# Patient Record
Sex: Female | Born: 1975 | Race: Black or African American | Hispanic: No | Marital: Married | Smoking: Never smoker
Health system: Southern US, Community
[De-identification: ages and names within clinical notes are randomized; demographics above are authoritative.]

## PROBLEM LIST (undated history)

## (undated) DIAGNOSIS — Z91018 Allergy to other foods: Secondary | ICD-10-CM

## (undated) DIAGNOSIS — Z91013 Allergy to seafood: Secondary | ICD-10-CM

## (undated) DIAGNOSIS — R0602 Shortness of breath: Secondary | ICD-10-CM

## (undated) HISTORY — DX: Allergy to seafood: Z91.013

## (undated) HISTORY — DX: Allergy to other foods: Z91.018

## (undated) HISTORY — DX: Shortness of breath: R06.02

---

## 2017-12-01 ENCOUNTER — Emergency Department (HOSPITAL_COMMUNITY): Payer: Medicaid Other

## 2017-12-01 ENCOUNTER — Observation Stay (HOSPITAL_COMMUNITY): Payer: Medicaid Other | Admitting: Anesthesiology

## 2017-12-01 ENCOUNTER — Encounter (HOSPITAL_COMMUNITY)
Admission: EM | Disposition: A | Discharge status: Home / Self Care | Payer: Self-pay | Source: Home / Self Care | Attending: Emergency Medicine

## 2017-12-01 ENCOUNTER — Observation Stay (HOSPITAL_COMMUNITY)
Admission: EM | Admit: 2017-12-01 | Discharge: 2017-12-02 | Disposition: A | Discharge status: Home / Self Care | Payer: Medicaid Other | Attending: General Surgery | Admitting: General Surgery

## 2017-12-01 ENCOUNTER — Encounter (HOSPITAL_COMMUNITY): Payer: Self-pay | Admitting: *Deleted

## 2017-12-01 DIAGNOSIS — R109 Unspecified abdominal pain: Secondary | ICD-10-CM | POA: Diagnosis present

## 2017-12-01 DIAGNOSIS — K3533 Acute appendicitis with perforation and localized peritonitis, with abscess: Principal | ICD-10-CM | POA: Insufficient documentation

## 2017-12-01 DIAGNOSIS — K3580 Unspecified acute appendicitis: Secondary | ICD-10-CM | POA: Diagnosis present

## 2017-12-01 HISTORY — PX: LAPAROSCOPIC APPENDECTOMY: SHX408

## 2017-12-01 LAB — COMPREHENSIVE METABOLIC PANEL
ALT: 36 U/L (ref 0–44)
AST: 24 U/L (ref 15–41)
Albumin: 3.4 g/dL — ABNORMAL LOW (ref 3.5–5.0)
Alkaline Phosphatase: 99 U/L (ref 38–126)
Anion gap: 9 (ref 5–15)
BUN: 6 mg/dL (ref 6–20)
CO2: 23 mmol/L (ref 22–32)
Calcium: 9.1 mg/dL (ref 8.9–10.3)
Chloride: 103 mmol/L (ref 98–111)
Creatinine, Ser: 0.78 mg/dL (ref 0.44–1.00)
GFR calc Af Amer: >60 mL/min (ref >60)
GFR calc non Af Amer: >60 mL/min (ref >60)
Glucose, Bld: 114 mg/dL — ABNORMAL HIGH (ref 70–99)
Potassium: 4.2 mmol/L (ref 3.5–5.1)
Sodium: 135 mmol/L (ref 135–145)
Total Bilirubin: 1 mg/dL (ref 0.3–1.2)
Total Protein: 7.6 g/dL (ref 6.5–8.1)

## 2017-12-01 LAB — CBC
HCT: 40.5 % (ref 36.0–46.0)
Hemoglobin: 13 g/dL (ref 12.0–15.0)
MCH: 27.5 pg (ref 26.0–34.0)
MCHC: 32.1 g/dL (ref 30.0–36.0)
MCV: 85.6 fL (ref 78.0–100.0)
Platelets: 411 10*3/uL — ABNORMAL HIGH (ref 150–400)
RBC: 4.73 MIL/uL (ref 3.87–5.11)
RDW: 14.5 % (ref 11.5–15.5)
WBC: 22.2 10*3/uL — ABNORMAL HIGH (ref 4.0–10.5)

## 2017-12-01 LAB — I-STAT BETA HCG BLOOD, ED (MC, WL, AP ONLY): I-stat hCG, quantitative: <5 m[IU]/mL (ref <5)

## 2017-12-01 LAB — LIPASE, BLOOD: Lipase: 24 U/L (ref 11–51)

## 2017-12-01 SURGERY — APPENDECTOMY, LAPAROSCOPIC
Anesthesia: General

## 2017-12-01 MED ORDER — MIDAZOLAM HCL 2 MG/2ML IJ SOLN
INTRAMUSCULAR | Status: AC
  Filled 2017-12-01: qty 2

## 2017-12-01 MED ORDER — GLYCOPYRROLATE 0.2 MG/ML IJ SOLN
INTRAMUSCULAR | Status: DC | PRN
  Administered 2017-12-01: 0.2 mg via INTRAVENOUS

## 2017-12-01 MED ORDER — TRAMADOL HCL 50 MG PO TABS
50.0000 mg | ORAL_TABLET | Freq: Four times a day (QID) | ORAL | Status: DC | PRN
  Administered 2017-12-02: 50 mg via ORAL
  Filled 2017-12-01: qty 1

## 2017-12-01 MED ORDER — SODIUM CHLORIDE 0.9 % IV SOLN
1.0000 g | INTRAVENOUS | Status: DC
  Administered 2017-12-01: 1 g via INTRAVENOUS
  Filled 2017-12-01 (×2): qty 10

## 2017-12-01 MED ORDER — PROPOFOL 10 MG/ML IV BOLUS
INTRAVENOUS | Status: AC
  Filled 2017-12-01: qty 20

## 2017-12-01 MED ORDER — MIDAZOLAM HCL 5 MG/5ML IJ SOLN
INTRAMUSCULAR | Status: DC | PRN
  Administered 2017-12-01: 2 mg via INTRAVENOUS

## 2017-12-01 MED ORDER — DEXAMETHASONE SODIUM PHOSPHATE 10 MG/ML IJ SOLN
INTRAMUSCULAR | Status: DC | PRN
  Administered 2017-12-01: 10 mg via INTRAVENOUS

## 2017-12-01 MED ORDER — LIDOCAINE HCL (CARDIAC) PF 100 MG/5ML IV SOSY
PREFILLED_SYRINGE | INTRAVENOUS | Status: DC | PRN
  Administered 2017-12-01: 100 mg via INTRATRACHEAL

## 2017-12-01 MED ORDER — LACTATED RINGERS IV SOLN
INTRAVENOUS | Status: DC | PRN
  Administered 2017-12-01: 20:00:00 via INTRAVENOUS

## 2017-12-01 MED ORDER — SUGAMMADEX SODIUM 200 MG/2ML IV SOLN
INTRAVENOUS | Status: DC | PRN
  Administered 2017-12-01 (×2): 100 mg via INTRAVENOUS

## 2017-12-01 MED ORDER — GABAPENTIN 300 MG PO CAPS: 300.0000 mg | ORAL_CAPSULE | ORAL | Status: DC

## 2017-12-01 MED ORDER — METRONIDAZOLE IN NACL 5-0.79 MG/ML-% IV SOLN
INTRAVENOUS | Status: DC | PRN
  Administered 2017-12-01: 500 mg via INTRAVENOUS

## 2017-12-01 MED ORDER — 0.9 % SODIUM CHLORIDE (POUR BTL) OPTIME
TOPICAL | Status: DC | PRN
  Administered 2017-12-01: 1000 mL

## 2017-12-01 MED ORDER — GABAPENTIN 300 MG PO CAPS
300.0000 mg | ORAL_CAPSULE | Freq: Two times a day (BID) | ORAL | Status: DC
  Administered 2017-12-01 – 2017-12-02 (×2): 300 mg via ORAL
  Filled 2017-12-01 (×2): qty 1

## 2017-12-01 MED ORDER — DIPHENHYDRAMINE HCL 50 MG/ML IJ SOLN
25.0000 mg | Freq: Once | INTRAMUSCULAR | Status: AC
  Administered 2017-12-01: 25 mg via INTRAVENOUS
  Filled 2017-12-01: qty 1

## 2017-12-01 MED ORDER — BUPIVACAINE HCL 0.25 % IJ SOLN
INTRAMUSCULAR | Status: DC | PRN
  Administered 2017-12-01: 7 mL

## 2017-12-01 MED ORDER — DIPHENHYDRAMINE HCL 50 MG/ML IJ SOLN
12.5000 mg | Freq: Once | INTRAMUSCULAR | Status: AC
  Administered 2017-12-01: 12.5 mg via INTRAVENOUS

## 2017-12-01 MED ORDER — ONDANSETRON HCL 4 MG/2ML IJ SOLN: 4.0000 mg | Freq: Four times a day (QID) | INTRAMUSCULAR | Status: DC | PRN

## 2017-12-01 MED ORDER — SODIUM CHLORIDE 0.9 % IR SOLN
Status: DC | PRN
  Administered 2017-12-01: 1000 mL

## 2017-12-01 MED ORDER — ROCURONIUM BROMIDE 100 MG/10ML IV SOLN
INTRAVENOUS | Status: DC | PRN
  Administered 2017-12-01: 30 mg via INTRAVENOUS

## 2017-12-01 MED ORDER — FENTANYL CITRATE (PF) 100 MCG/2ML IJ SOLN
50.0000 ug | Freq: Once | INTRAMUSCULAR | Status: DC
  Filled 2017-12-01: qty 2

## 2017-12-01 MED ORDER — ALBUTEROL SULFATE (2.5 MG/3ML) 0.083% IN NEBU
INHALATION_SOLUTION | RESPIRATORY_TRACT | Status: AC
  Administered 2017-12-01: 2.5 mg via RESPIRATORY_TRACT
  Filled 2017-12-01: qty 3

## 2017-12-01 MED ORDER — DEXTROSE-NACL 5-0.9 % IV SOLN
INTRAVENOUS | Status: DC
  Administered 2017-12-01: 23:00:00 via INTRAVENOUS

## 2017-12-01 MED ORDER — SUFENTANIL CITRATE 50 MCG/ML IV SOLN
INTRAVENOUS | Status: AC
  Filled 2017-12-01: qty 1

## 2017-12-01 MED ORDER — SUCCINYLCHOLINE CHLORIDE 20 MG/ML IJ SOLN
INTRAMUSCULAR | Status: DC | PRN
  Administered 2017-12-01: 120 mg via INTRAVENOUS

## 2017-12-01 MED ORDER — PROPOFOL 10 MG/ML IV BOLUS
INTRAVENOUS | Status: DC | PRN
  Administered 2017-12-01: 200 mg via INTRAVENOUS

## 2017-12-01 MED ORDER — DIPHENHYDRAMINE HCL 50 MG/ML IJ SOLN
INTRAMUSCULAR | Status: AC
  Administered 2017-12-01: 12.5 mg via INTRAVENOUS
  Filled 2017-12-01: qty 1

## 2017-12-01 MED ORDER — SUFENTANIL CITRATE 50 MCG/ML IV SOLN
INTRAVENOUS | Status: DC | PRN
  Administered 2017-12-01 (×3): 10 ug via INTRAVENOUS

## 2017-12-01 MED ORDER — CELECOXIB 400 MG PO CAPS: 400.0000 mg | ORAL_CAPSULE | ORAL | Status: DC

## 2017-12-01 MED ORDER — FENTANYL CITRATE (PF) 100 MCG/2ML IJ SOLN: 25.0000 ug | INTRAMUSCULAR | Status: DC | PRN

## 2017-12-01 MED ORDER — BUPIVACAINE HCL (PF) 0.25 % IJ SOLN
INTRAMUSCULAR | Status: AC
  Filled 2017-12-01: qty 30

## 2017-12-01 MED ORDER — METRONIDAZOLE IN NACL 5-0.79 MG/ML-% IV SOLN
500.0000 mg | Freq: Three times a day (TID) | INTRAVENOUS | Status: DC
  Filled 2017-12-01 (×2): qty 100

## 2017-12-01 MED ORDER — ACETAMINOPHEN 10 MG/ML IV SOLN: 1000.0000 mg | Freq: Once | INTRAVENOUS | Status: DC | PRN

## 2017-12-01 MED ORDER — ALBUTEROL SULFATE (2.5 MG/3ML) 0.083% IN NEBU
2.5000 mg | INHALATION_SOLUTION | Freq: Once | RESPIRATORY_TRACT | Status: AC
  Administered 2017-12-01: 2.5 mg via RESPIRATORY_TRACT

## 2017-12-01 MED ORDER — KETOROLAC TROMETHAMINE 30 MG/ML IJ SOLN
30.0000 mg | Freq: Four times a day (QID) | INTRAMUSCULAR | Status: DC | PRN
  Administered 2017-12-01: 30 mg via INTRAVENOUS
  Filled 2017-12-01: qty 1

## 2017-12-01 MED ORDER — KETOROLAC TROMETHAMINE 30 MG/ML IJ SOLN: 30.0000 mg | Freq: Four times a day (QID) | INTRAMUSCULAR | Status: DC | PRN

## 2017-12-01 MED ORDER — CELECOXIB 200 MG PO CAPS
200.0000 mg | ORAL_CAPSULE | Freq: Two times a day (BID) | ORAL | Status: DC
  Administered 2017-12-01: 200 mg via ORAL
  Filled 2017-12-01: qty 1

## 2017-12-01 MED ORDER — ACETAMINOPHEN 500 MG PO TABS: 1000.0000 mg | ORAL_TABLET | ORAL | Status: DC

## 2017-12-01 MED ORDER — ONDANSETRON 4 MG PO TBDP: 4.0000 mg | ORAL_TABLET | Freq: Four times a day (QID) | ORAL | Status: DC | PRN

## 2017-12-01 MED ORDER — IOHEXOL 300 MG/ML  SOLN
100.0000 mL | Freq: Once | INTRAMUSCULAR | Status: AC | PRN
  Administered 2017-12-01: 100 mL via INTRAVENOUS

## 2017-12-01 MED ORDER — METRONIDAZOLE IN NACL 5-0.79 MG/ML-% IV SOLN
500.0000 mg | Freq: Three times a day (TID) | INTRAVENOUS | Status: DC
  Administered 2017-12-01 – 2017-12-02 (×2): 500 mg via INTRAVENOUS
  Filled 2017-12-01 (×3): qty 100

## 2017-12-01 MED ORDER — LACTATED RINGERS IV SOLN: INTRAVENOUS | Status: DC

## 2017-12-01 MED ORDER — HYDROMORPHONE HCL 1 MG/ML IJ SOLN
0.5000 mg | INTRAMUSCULAR | Status: DC | PRN
  Filled 2017-12-01: qty 1

## 2017-12-01 MED ORDER — ONDANSETRON HCL 4 MG/2ML IJ SOLN
INTRAMUSCULAR | Status: DC | PRN
  Administered 2017-12-01: 4 mg via INTRAVENOUS

## 2017-12-01 MED ORDER — DEXTROSE-NACL 5-0.9 % IV SOLN
INTRAVENOUS | Status: DC
  Administered 2017-12-01: 18:00:00 via INTRAVENOUS

## 2017-12-01 MED ORDER — ONDANSETRON HCL 4 MG/2ML IJ SOLN: 4.0000 mg | Freq: Once | INTRAMUSCULAR | Status: DC | PRN

## 2017-12-01 SURGICAL SUPPLY — 43 items
APPLIER CLIP 5 13 M/L LIGAMAX5 (MISCELLANEOUS)
BENZOIN TINCTURE PRP APPL 2/3 (GAUZE/BANDAGES/DRESSINGS) ×2 IMPLANT
BLADE CLIPPER SURG (BLADE) IMPLANT
CANISTER SUCT 3000ML PPV (MISCELLANEOUS) ×2 IMPLANT
CHLORAPREP W/TINT 26ML (MISCELLANEOUS) ×2 IMPLANT
CLIP APPLIE 5 13 M/L LIGAMAX5 (MISCELLANEOUS) IMPLANT
CONT SPEC 4OZ CLIKSEAL STRL BL (MISCELLANEOUS) ×2 IMPLANT
COVER SURGICAL LIGHT HANDLE (MISCELLANEOUS) ×2 IMPLANT
COVER TRANSDUCER ULTRASND (DRAPES) ×2 IMPLANT
DECANTER SPIKE VIAL GLASS SM (MISCELLANEOUS) ×2 IMPLANT
DERMABOND ADVANCED (GAUZE/BANDAGES/DRESSINGS) ×1
DERMABOND ADVANCED .7 DNX12 (GAUZE/BANDAGES/DRESSINGS) ×1 IMPLANT
ELECT REM PT RETURN 9FT ADLT (ELECTROSURGICAL) ×2
ELECTRODE REM PT RTRN 9FT ADLT (ELECTROSURGICAL) ×1 IMPLANT
ENDOLOOP SUT PDS II  0 18 (SUTURE) ×3
ENDOLOOP SUT PDS II 0 18 (SUTURE) ×3 IMPLANT
GAUZE SPONGE 2X2 8PLY STRL LF (GAUZE/BANDAGES/DRESSINGS) ×1 IMPLANT
GLOVE BIO SURGEON STRL SZ7.5 (GLOVE) ×2 IMPLANT
GOWN STRL REUS W/ TWL LRG LVL3 (GOWN DISPOSABLE) ×2 IMPLANT
GOWN STRL REUS W/ TWL XL LVL3 (GOWN DISPOSABLE) ×1 IMPLANT
GOWN STRL REUS W/TWL LRG LVL3 (GOWN DISPOSABLE) ×2
GOWN STRL REUS W/TWL XL LVL3 (GOWN DISPOSABLE) ×1
GRASPER SUT TROCAR 14GX15 (MISCELLANEOUS) ×2 IMPLANT
KIT BASIN OR (CUSTOM PROCEDURE TRAY) ×2 IMPLANT
KIT TURNOVER KIT B (KITS) ×2 IMPLANT
NEEDLE INSUFFLATION 14GA 120MM (NEEDLE) ×2 IMPLANT
NS IRRIG 1000ML POUR BTL (IV SOLUTION) ×2 IMPLANT
PAD ARMBOARD 7.5X6 YLW CONV (MISCELLANEOUS) ×4 IMPLANT
SCISSORS LAP 5X35 DISP (ENDOMECHANICALS) ×2 IMPLANT
SET IRRIG TUBING LAPAROSCOPIC (IRRIGATION / IRRIGATOR) ×2 IMPLANT
SLEEVE ENDOPATH XCEL 5M (ENDOMECHANICALS) ×2 IMPLANT
SPECIMEN JAR SMALL (MISCELLANEOUS) ×2 IMPLANT
SPONGE GAUZE 2X2 STER 10/PKG (GAUZE/BANDAGES/DRESSINGS) ×1
STRIP CLOSURE SKIN 1/2X4 (GAUZE/BANDAGES/DRESSINGS) ×2 IMPLANT
SUT MNCRL AB 4-0 PS2 18 (SUTURE) ×2 IMPLANT
TOWEL OR 17X24 6PK STRL BLUE (TOWEL DISPOSABLE) ×2 IMPLANT
TOWEL OR 17X26 10 PK STRL BLUE (TOWEL DISPOSABLE) ×2 IMPLANT
TRAY FOLEY CATH SILVER 16FR (SET/KITS/TRAYS/PACK) ×2 IMPLANT
TRAY LAPAROSCOPIC MC (CUSTOM PROCEDURE TRAY) ×2 IMPLANT
TROCAR XCEL NON-BLD 11X100MML (ENDOMECHANICALS) ×2 IMPLANT
TROCAR XCEL NON-BLD 5MMX100MML (ENDOMECHANICALS) ×2 IMPLANT
TUBING INSUFFLATION (TUBING) ×2 IMPLANT
WATER STERILE IRR 1000ML POUR (IV SOLUTION) ×2 IMPLANT

## 2017-12-01 NOTE — H&P (Signed)
Saraih Lorton is an 42 y.o. female.   Chief Complaint: abdominal pain  HPI: Pt is a 42 y/o F with 1/5d h/o abd pain.  Pt with RLQ pain from the start.  She states she had n/v and denies diarrhea.  She has had chills while at home.    On ED eval she underwent CT with revealed Acute appendicitis with no perf.  I did review this personally. Gen Surg was consulted for further eval and mgmt.  History reviewed. No pertinent past medical history.  History reviewed. No pertinent surgical history.  History reviewed. No pertinent family history. Social History:  reports that she has never smoked. She has never used smokeless tobacco. Her alcohol and drug histories are not on file.  Allergies:  Allergies  Allergen Reactions  . Shellfish Allergy      (Not in a hospital admission)  Results for orders placed or performed during the hospital encounter of 12/01/17 (from the past 48 hour(s))  Lipase, blood     Status: None   Collection Time: 12/01/17  1:40 PM  Result Value Ref Range   Lipase 24 11 - 51 U/L    Comment: Performed at [facility], [address] [place], [place] [address]  Comprehensive metabolic panel     Status: Abnormal   Collection Time: 12/01/17  1:40 PM  Result Value Ref Range   Sodium 135 135 - 145 mmol/L   Potassium 4.2 3.5 - 5.1 mmol/L   Chloride 103 98 - 111 mmol/L   CO2 23 22 - 32 mmol/L   Glucose, Bld 114 (H) 70 - 99 mg/dL   BUN 6 6 - 20 mg/dL   Creatinine, Ser 0.78 0.44 - 1.00 mg/dL   Calcium 9.1 8.9 - 10.3 mg/dL   Total Protein 7.6 6.5 - 8.1 g/dL   Albumin 3.4 (L) 3.5 - 5.0 g/dL   AST 24 15 - 41 U/L   ALT 36 0 - 44 U/L   Alkaline Phosphatase 99 38 - 126 U/L   Total Bilirubin 1.0 0.3 - 1.2 mg/dL   GFR calc non Af Amer >60 >60 mL/min   GFR calc Af Amer >60 >60 mL/min    Comment: (NOTE) The eGFR has been calculated using the CKD EPI equation. This calculation has not been validated in all clinical situations. eGFR's persistently <60 mL/min  signify possible Chronic Kidney Disease.    Anion gap 9 5 - 15    Comment: Performed at [facility] [address], [place], [place] [address]  CBC     Status: Abnormal   Collection Time: 12/01/17  1:40 PM  Result Value Ref Range   WBC 22.2 (H) 4.0 - 10.5 K/uL   RBC 4.73 3.87 - 5.11 MIL/uL   Hemoglobin 13.0 12.0 - 15.0 g/dL   HCT 40.5 36.0 - 46.0 %   MCV 85.6 78.0 - 100.0 fL   MCH 27.5 26.0 - 34.0 pg   MCHC 32.1 30.0 - 36.0 g/dL   RDW 14.5 11.5 - 15.5 %   Platelets 411 (H) 150 - 400 K/uL    Comment: Performed at [facility], [place] [address] [place], [place] [address]  I-Stat beta hCG blood, ED     Status: None   Collection Time: 12/01/17  2:13 PM  Result Value Ref Range   I-stat hCG, quantitative <5.0 <5 mIU/mL   Comment 3            Comment:   GEST.  AGE      CONC.  (mIU/mL)   <=1 WEEK        5 - 50     2 WEEKS       50 - 500     3 WEEKS       100 - 10,000     4 WEEKS     1,000 - 30,000        FEMALE AND NON-PREGNANT FEMALE:     LESS THAN 5 mIU/mL    Ct Abdomen Pelvis W Contrast  Result Date: 12/01/2017 CLINICAL DATA:  Right lower quadrant pain for 1 day EXAM: CT ABDOMEN AND PELVIS WITH CONTRAST TECHNIQUE: Multidetector CT imaging of the abdomen and pelvis was performed using the standard protocol following bolus administration of intravenous contrast. CONTRAST:  160m OMNIPAQUE IOHEXOL 300 MG/ML  SOLN COMPARISON:  None. FINDINGS: Lower chest: Subsegmental atelectasis Hepatobiliary: Diffuse hepatic steatosis.  1.3 cm gallstone. Pancreas: Unremarkable Spleen: Upper normal in size.  No mass. Adrenals/Urinary Tract: Unremarkable Stomach/Bowel: The appendix is distended. There is stranding in the adjacent fat. There is no extraluminal bowel gas. There is no abscess. These findings are compatible with acute appendicitis without perforation. No disproportionate dilatation of bowel to suggest obstruction. Decompressed stomach. Vascular/Lymphatic: No evidence of aortic  aneurysm. No abnormal retroperitoneal adenopathy. Reproductive: 2.4 cm fundal uterine fibroid. Adnexa are unremarkable. Other: Trace free fluid in the right hemipelvis. Musculoskeletal: No vertebral compression deformity. IMPRESSION: Acute appendicitis without evidence of perforation or abscess. Cholelithiasis. Uterine fibroid. Trace free-fluid. Diffuse hepatic steatosis. Critical Value/emergent results were called by telephone at the time of interpretation on 12/01/2017 at 4:31 pm to Dr. JCarlisle Cater, who verbally acknowledged these results. Electronically Signed   By: AMarybelle KillingsM.D.   On: 12/01/2017 16:31    Review of Systems  Constitutional: Negative for chills, fever and malaise/fatigue.  HENT: Negative for ear discharge, hearing loss and sore throat.   Eyes: Negative for blurred vision and discharge.  Respiratory: Negative for cough and shortness of breath.   Cardiovascular: Negative for chest pain, orthopnea and leg swelling.  Gastrointestinal: Positive for abdominal pain, nausea and vomiting. Negative for constipation, diarrhea and heartburn.  Musculoskeletal: Negative for myalgias and neck pain.  Skin: Negative for itching and rash.  Neurological: Negative for dizziness, focal weakness, seizures and loss of consciousness.  Endo/Heme/Allergies: Negative for environmental allergies. Does not bruise/bleed easily.  Psychiatric/Behavioral: Negative for depression and suicidal ideas.  All other systems reviewed and are negative.   Blood pressure 133/82, pulse 90, temperature 99.5 F (37.5 C), temperature source Oral, resp. rate 20, last menstrual period 11/10/2017, SpO2 98 %. Physical Exam  Constitutional: She is oriented to person, place, and time. Vital signs are normal. She appears well-developed and well-nourished.  Conversant No acute distress  HENT:  Head: Normocephalic and atraumatic.  Eyes: Lids are normal. No scleral icterus.  No lid lag Moist conjunctiva  Neck: Normal  range of motion. Neck supple. No tracheal tenderness present. No thyromegaly present.  No cervical lymphadenopathy  Cardiovascular: Normal rate, regular rhythm and intact distal pulses.  No murmur heard. Respiratory: Effort normal and breath sounds normal. She has no wheezes. She has no rales.  GI: Soft. Bowel sounds are normal. There is no hepatosplenomegaly. There is tenderness (RLQ). There is no rebound and no guarding. No hernia.  Neurological: She is alert and oriented to person, place, and time.  Normal gait and station  Skin: Skin is warm. No rash noted. No cyanosis. Nails  show no clubbing.  Normal skin turgor  Psychiatric: Judgment normal.  Appropriate affect     Assessment/Plan 16F with acute appendicitis 1. To OR for Lap appy 2. I discussed with the patient the risks benefits of the procedure to include but not limited to: Infection, bleeding, damage to surrounding structures, possible ileus, possible postoperative infection. Patient voiced understanding and wishes to proceed.   Reyes Ivan, MD 12/01/2017, 5:30 PM

## 2017-12-01 NOTE — Op Note (Signed)
12/01/2017  8:40 PM  PATIENT:  Regina [name]  42 y.o. female  PRE-OPERATIVE DIAGNOSIS:  Appendicitis  POST-OPERATIVE DIAGNOSIS:  Grangrenous Appendicitis  PROCEDURE:  Procedure(s): APPENDECTOMY LAPAROSCOPIC (N/A)  SURGEON:  Surgeon(s) and Role:    * Axel Filleramirez, Kaira Stringfield, MD - Primary  ANESTHESIA:   local and general  EBL:  3 mL   BLOOD ADMINISTERED:none  DRAINS: none   LOCAL MEDICATIONS USED:  BUPIVICAINE   SPECIMEN:  Source of Specimen:  appendix  DISPOSITION OF SPECIMEN:  PATHOLOGY  COUNTS:  YES  TOURNIQUET:  * No tourniquets in log *  DICTATION: .Dragon Dictation Complications: none  Counts: reported as correct x 2  Findings:  The patient had a acutely inflamed gangrenous appendix  Specimen: Appendix  Indications for procedure:  The patient is a 42 year old female with a history of periumbilical pain localized in the right lower quadrant patient had a CT scan which revealed signs consistent with acute appendicitis the patient back in for laparoscopic appendectomy.  Details of the procedure:The patient was taken back to the operating room. The patient was placed in supine position with bilateral SCDs in place.   The patient was prepped and draped in the usual sterile fashion.  After appropriate anitbiotics were confirmed, a time-out was confirmed and all facts were verified.    A pneumoperitoneum of 14 mmHg was obtained via a Veress needle technique in the left lower quadrant quadrant.  A 5 mm trocar and 5 mm camera then placed intra-abdominally there is no injury to any intra-abdominal organs a 10 mm infraumbilical port was placed and direct visualization as was a 5 mm port in the suprapubic area.   The appendix was identified and seen to be gangrenous, but non perforated.  The appendix was cleaned down to the appendiceal base. The mesoappendix was then incised and the appendiceal artery was cauterized.  The the appendiceal base was clean.  At this time an Endoloop  was placed proximallyx2 and one distally and the appendix was transected between these 2. A retrieval bag was then placed into the abdomen and the specimen placed in the bag. The appendiceal stump was cauterized. We evacuate the fluid from the pelvis until the effluent was clear.  The appendix and retrieval  bag was then retrieved via the supraumbilical port. #1 Vicryl was used to reapproximate the fascia at the umbilical port site x1. The skin was reapproximated all port sites 3-0 Monocryl subcuticular fashion. The skin was dressed with steri-strips, guaze, and tape.   The patient was awakened from general anesthesia was taken to recovery room in stable condition.      PLAN OF CARE: Admit for obs  PATIENT DISPOSITION:  PACU - hemodynamically stable.   Delay start of Pharmacological VTE agent (>24hrs) due to surgical blood loss or risk of bleeding: not applicable

## 2017-12-01 NOTE — Anesthesia Preprocedure Evaluation (Addendum)
Anesthesia Evaluation  Patient identified by MRN, date of birth, ID band Patient awake    Reviewed: Allergy & Precautions, NPO status , Patient's Chart, lab work & pertinent test results  Airway Mallampati: II  TM Distance: >3 FB Neck ROM: Full    Dental  (+) Dental Advisory Given   Pulmonary neg pulmonary ROS,    breath sounds clear to auscultation       Cardiovascular negative cardio ROS   Rhythm:Regular Rate:Normal     Neuro/Psych negative neurological ROS     GI/Hepatic Neg liver ROS, Acute appendicitis   Endo/Other  negative endocrine ROS  Renal/GU negative Renal ROS     Musculoskeletal   Abdominal   Peds  Hematology negative hematology ROS (+)   Anesthesia Other Findings   Reproductive/Obstetrics                            Lab Results  Component Value Date   WBC 22.2 (H) 12/01/2017   HGB 13.0 12/01/2017   HCT 40.5 12/01/2017   MCV 85.6 12/01/2017   PLT 411 (H) 12/01/2017   Lab Results  Component Value Date   CREATININE 0.78 12/01/2017   BUN 6 12/01/2017   NA 135 12/01/2017   K 4.2 12/01/2017   CL 103 12/01/2017   CO2 23 12/01/2017    Anesthesia Physical Anesthesia Plan  ASA: II and emergent  Anesthesia Plan: General   Post-op Pain Management:    Induction: Intravenous and Rapid sequence  PONV Risk Score and Plan: 3 and Ondansetron, Dexamethasone, Treatment may vary due to age or medical condition and Midazolam  Airway Management Planned: Oral ETT  Additional Equipment:   Intra-op Plan:   Post-operative Plan: Extubation in OR  Informed Consent: I have reviewed the patients History and Physical, chart, labs and discussed the procedure including the risks, benefits and alternatives for the proposed anesthesia with the patient or authorized representative who has indicated his/her understanding and acceptance.   Dental advisory given  Plan Discussed with:  CRNA  Anesthesia Plan Comments:         Anesthesia Quick Evaluation

## 2017-12-01 NOTE — ED Provider Notes (Signed)
Patient placed in Quick Look pathway, seen and evaluated   Chief Complaint: RLQ abd pain  HPI: Patient with past history of C-section, no other abdominal surgeries presents with 1 to 2 days of right lower quadrant abdominal pain with associated nausea, vomiting.  She has had decreased appetite.  Pain is worse with movement.  She denies any fevers.  No increased urinary frequency, hematuria, dysuria.  Pain does not radiate.  No vaginal bleeding or discharge.  Patient states that she is expecting her menstrual period to start soon.  Last period was normal for her.  ROS:  Positive ROS: (+) Right lower quadrant abdominal pain, nausea, vomiting Negative ROS: (-) Fever, urinary symptoms  Physical Exam:   Gen: No distress  Neuro: Awake and Alert  Skin: Warm    Focused Exam: General patient appears uncomfortable; heart RRR, nml S1,S2, no m/r/g; Lungs CTAB; Abd soft, focal tenderness in the right lower quadrant with voluntary guarding, no rebound; Ext 2+ pedal pulses bilaterally, no edema.  BP 133/82 (BP Location: Right Arm)   Pulse 90   Temp 99.5 F (37.5 C) (Oral)   Resp 20   SpO2 98%   Plan: Initial labs and UA ordered.  Will need to rule out pregnancy.  Patient will likely need imaging given focal right lower quadrant tenderness.  Initiation of care has begun. The patient has been counseled on the process, plan, and necessity for staying for the completion/evaluation, and the remainder of the medical screening examination    Renne CriglerGeiple, Lilygrace Rodick, Cordelia Poche-C 12/01/17 1328    Alvira MondaySchlossman, Erin, MD 12/02/17 [phone]

## 2017-12-01 NOTE — ED Notes (Signed)
Per Nehemiah SettleBrooke, RN pt given benadryl for hives. Pt denies any respiratory distress to this RN.

## 2017-12-01 NOTE — Transfer of Care (Signed)
Immediate Anesthesia Transfer of Care Note  Patient: Regina [name]  Procedure(s) Performed: APPENDECTOMY LAPAROSCOPIC (N/A )  Patient Location: PACU  Anesthesia Type:General  Level of Consciousness: oriented, sedated, drowsy, patient cooperative and responds to stimulation  Airway & Oxygen Therapy: Patient Spontanous Breathing and Patient connected to face mask oxygen  Post-op Assessment: Report given to RN, Post -op Vital signs reviewed and stable and Patient moving all extremities X 4  Post vital signs: Reviewed and stable  Last Vitals:  Vitals Value Taken Time  BP 100/83 12/01/2017  8:50 PM  Temp 37.3 C 12/01/2017  8:48 PM  Pulse 93 12/01/2017  8:56 PM  Resp 21 12/01/2017  8:56 PM  SpO2 100 % 12/01/2017  8:56 PM  Vitals shown include unvalidated device data.  Last Pain:  Vitals:   12/01/17 2048  TempSrc:   PainSc: 0-No pain         Complications: No apparent anesthesia complications

## 2017-12-01 NOTE — ED Notes (Signed)
Consent for surgery signed by pt, Derrell Lollingamirez, MD, and this RN signed as witness. Pt denied any further questions for Derrell Lollingamirez, MD. Consent at bedside.

## 2017-12-01 NOTE — Progress Notes (Signed)
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 Hives and at times eye swelling. Usually taking shower helps

## 2017-12-01 NOTE — Anesthesia Procedure Notes (Signed)
Procedure Name: Intubation Date/Time: 12/01/2017 7:46 PM Performed by: Claris Che, CRNA Pre-anesthesia Checklist: Patient identified, Emergency Drugs available, Suction available, Patient being monitored and Timeout performed Patient Re-evaluated:Patient Re-evaluated prior to induction Oxygen Delivery Method: Circle system utilized Induction Type: IV induction, Rapid sequence and Cricoid Pressure applied Laryngoscope Size: Mac and 3 Grade View: Grade II Tube type: Oral Tube size: 7.5 mm Number of attempts: 1 Airway Equipment and Method: Stylet Placement Confirmation: ETT inserted through vocal cords under direct vision,  positive ETCO2 and breath sounds checked- equal and bilateral Secured at: 22 cm Tube secured with: Tape Dental Injury: Teeth and Oropharynx as per pre-operative assessment

## 2017-12-01 NOTE — ED Provider Notes (Signed)
[facility] Provider Note   CSN: [id number] Arrival date & time: 12/01/17  1317     History   Chief Complaint Chief Complaint  Patient presents with  . Abdominal Pain    HPI Regina [name] is a 42 y.o. female.  Patient with past history of C-section, no other abdominal surgeries presents with 1 to 2 days of right lower quadrant abdominal pain with associated nausea, vomiting.  She has had decreased appetite.  Pain is worse with movement.  She denies any fevers.  No increased urinary frequency, hematuria, dysuria.  Pain does not radiate.  No vaginal bleeding or discharge.  Patient states that she is expecting her menstrual period to start soon.  Last period was normal for her.  Last oral intake this AM.       History reviewed. No pertinent past medical history.  Patient Active Problem List   Diagnosis Date Noted  . Acute appendicitis 12/01/2017    History reviewed. No pertinent surgical history.   OB History   None      Home Medications    Prior to Admission medications   Not on File    Family History History reviewed. No pertinent family history.  Social History Social History   Tobacco Use  . Smoking status: Never Smoker  . Smokeless tobacco: Never Used  Substance Use Topics  . Alcohol use: Not on file  . Drug use: Not on file     Allergies   Shellfish allergy   Review of Systems Review of Systems  Constitutional: Positive for appetite change. Negative for fever.  HENT: Negative for rhinorrhea and sore throat.   Eyes: Negative for redness.  Respiratory: Negative for cough.   Cardiovascular: Negative for chest pain.  Gastrointestinal: Positive for abdominal pain, nausea and vomiting. Negative for diarrhea.  Genitourinary: Negative for dysuria, vaginal bleeding and vaginal discharge.  Musculoskeletal: Negative for myalgias.  Skin: Negative for rash.  Neurological: Negative for headaches.      Physical Exam Updated Vital Signs BP 133/73   Pulse 82   Temp 99.5 F (37.5 C) (Oral)   Resp 18   LMP 11/10/2017   SpO2 99%   Physical Exam  Constitutional: She appears well-developed and well-nourished.  HENT:  Head: Normocephalic and atraumatic.  Eyes: Conjunctivae are normal. Right eye exhibits no discharge. Left eye exhibits no discharge.  Neck: Normal range of motion. Neck supple.  Cardiovascular: Normal rate, regular rhythm and normal heart sounds.  Pulmonary/Chest: Effort normal and breath sounds normal.  Abdominal: Soft. There is tenderness in the right lower quadrant. There is guarding and tenderness at McBurney's point. There is no rebound.  Neurological: She is alert.  Skin: Skin is warm and dry.  Psychiatric: She has a normal mood and affect.  Nursing note and vitals reviewed.    ED Treatments / Results  Labs (all labs ordered are listed, but only abnormal results are displayed) Labs Reviewed  COMPREHENSIVE METABOLIC PANEL - Abnormal; Notable for the following components:      Result Value   Glucose, Bld 114 (*)    Albumin 3.4 (*)    All other components within normal limits  CBC - Abnormal; Notable for the following components:   WBC 22.2 (*)    Platelets 411 (*)    All other components within normal limits  LIPASE, BLOOD  URINALYSIS, ROUTINE W REFLEX MICROSCOPIC  I-STAT BETA HCG BLOOD, ED ([facility], [facility], [facility] ONLY)    EKG None  Radiology  Ct Abdomen Pelvis W Contrast  Result Date: 12/01/2017 CLINICAL DATA:  Right lower quadrant pain for 1 day EXAM: CT ABDOMEN AND PELVIS WITH CONTRAST TECHNIQUE: Multidetector CT imaging of the abdomen and pelvis was performed using the standard protocol following bolus administration of intravenous contrast. CONTRAST:  OMNIPAQUE IOHEXOL 300 MG/ML  SOLN COMPARISON:  None. FINDINGS: Lower chest: Subsegmental atelectasis Hepatobiliary: Diffuse hepatic steatosis.  1.3 cm gallstone. Pancreas: Unremarkable Spleen: Upper  normal in size.  No mass. Adrenals/Urinary Tract: Unremarkable Stomach/Bowel: The appendix is distended. There is stranding in the adjacent fat. There is no extraluminal bowel gas. There is no abscess. These findings are compatible with acute appendicitis without perforation. No disproportionate dilatation of bowel to suggest obstruction. Decompressed stomach. Vascular/Lymphatic: No evidence of aortic aneurysm. No abnormal retroperitoneal adenopathy. Reproductive: 2.4 cm fundal uterine fibroid. Adnexa are unremarkable. Other: Trace free fluid in the right hemipelvis. Musculoskeletal: No vertebral compression deformity. IMPRESSION: Acute appendicitis without evidence of perforation or abscess. Cholelithiasis. Uterine fibroid. Trace free-fluid. Diffuse hepatic steatosis. Critical Value/emergent results were called by telephone at the time of interpretation on 12/01/2017 at 4:31 pm to Dr. Renne Crigler , who verbally acknowledged these results. Electronically Signed   By: Jolaine Click M.D.   On: 12/01/2017 16:31    Procedures Procedures (including critical care time)  Medications Ordered in ED Medications  cefTRIAXone (ROCEPHIN) 1 g in sodium chloride 0.9 % 100 mL IVPB (has no administration in time range)  metroNIDAZOLE (FLAGYL) IVPB 500 mg (has no administration in time range)  dextrose 5 %-0.9 % sodium chloride infusion (has no administration in time range)  ketorolac (TORADOL) 30 MG/ML injection 30 mg (has no administration in time range)  HYDROmorphone (DILAUDID) injection 0.5 mg (has no administration in time range)  iohexol (OMNIPAQUE) 300 MG/ML solution 100 mL (100 mLs Intravenous Contrast Given 12/01/17 1608)     Initial Impression / Assessment and Plan / ED Course  I have reviewed the triage vital signs and the nursing notes.  Pertinent labs & imaging results that were available during my care of the patient were reviewed by me and considered in my medical decision making (see chart for  details).     Patient seen and examined initially by myself and fast.  CT ordered after WBC noted to be 22k without active pregnancy.    CT showed acute appendicitis. Discussed with radiologist.   Vital signs reviewed and are as follows: BP 133/73   Pulse 82   Temp 99.5 F (37.5 C) (Oral)   Resp 18   LMP 11/10/2017   SpO2 99%   Spoke with Dr. Derrell Lolling of general surgery who will see patient and admit for surgery.  Patient and mother at bedside, updated on plan.  Patient declined pain medication on arrival and at time of recheck.  Final Clinical Impressions(s) / ED Diagnoses   Final diagnoses:  Acute appendicitis, unspecified acute appendicitis type   Admit for acute uncomplicated appendicitis.  ED Discharge Orders    None       Renne Crigler, Cordelia Poche 12/01/17 1820    Mesner, Barbara Cower, MD 12/02/17 [phone]

## 2017-12-01 NOTE — ED Triage Notes (Addendum)
Pt in c/o right sided abdominal pain with n/v/d that started yesterday, also reports back pain earlier in the week, pain is worse with movement

## 2017-12-02 ENCOUNTER — Encounter (HOSPITAL_COMMUNITY): Payer: Self-pay | Admitting: *Deleted

## 2017-12-02 ENCOUNTER — Other Ambulatory Visit: Payer: Self-pay

## 2017-12-02 LAB — URINALYSIS, ROUTINE W REFLEX MICROSCOPIC
Bacteria, UA: NONE SEEN
Bilirubin Urine: NEGATIVE
Glucose, UA: NEGATIVE mg/dL
Hgb urine dipstick: NEGATIVE
Ketones, ur: 20 mg/dL — AB
Nitrite: NEGATIVE
Protein, ur: 30 mg/dL — AB
Specific Gravity, Urine: 1.03 (ref 1.005–1.030)
pH: 5 (ref 5.0–8.0)

## 2017-12-02 MED ORDER — AMOXICILLIN-POT CLAVULANATE 875-125 MG PO TABS
1.0000 | ORAL_TABLET | Freq: Two times a day (BID) | ORAL | Status: DC
  Administered 2017-12-02: 1 via ORAL
  Filled 2017-12-02: qty 1

## 2017-12-02 MED ORDER — ACETAMINOPHEN 500 MG PO TABS
1000.0000 mg | ORAL_TABLET | Freq: Three times a day (TID) | ORAL | Status: DC
  Administered 2017-12-02: 1000 mg via ORAL
  Filled 2017-12-02: qty 2

## 2017-12-02 MED ORDER — IBUPROFEN 600 MG PO TABS: 600.0000 mg | ORAL_TABLET | Freq: Four times a day (QID) | ORAL | Status: DC | PRN

## 2017-12-02 MED ORDER — IBUPROFEN 200 MG PO TABS: ORAL_TABLET | ORAL | Status: DC

## 2017-12-02 MED ORDER — TRAMADOL HCL 50 MG PO TABS: 50.0000 mg | ORAL_TABLET | Freq: Four times a day (QID) | ORAL | 0 refills | Status: DC | PRN

## 2017-12-02 MED ORDER — ACETAMINOPHEN 500 MG PO TABS: ORAL_TABLET | ORAL | 0 refills | Status: DC

## 2017-12-02 MED ORDER — AMOXICILLIN-POT CLAVULANATE 875-125 MG PO TABS: 1.0000 | ORAL_TABLET | Freq: Two times a day (BID) | ORAL | 0 refills | Status: DC

## 2017-12-02 NOTE — Progress Notes (Signed)
Discharge home. Home discharge instruction given, no question verbalized. 

## 2017-12-02 NOTE — Progress Notes (Signed)
1 Day Post-Op    CC: Abdominal pain  Subjective: Patient is doing well this a.m.  She did not know she had ordered breakfast.  Port sites look fine.  She has been up walking and voiding some.  Objective: Vital signs in last 24 hours: Temp:  [98.6 F (37 C)-99.5 F (37.5 C)] 98.8 F (37.1 C) (09/14 0510) Pulse Rate:  [82-110] 88 (09/14 0510) Resp:  [16-20] 18 (09/14 0510) BP: (100-133)/(64-83) 120/76 (09/14 0510) SpO2:  [95 %-100 %] 99 % (09/14 0510) Weight:  [109.8 kg] 109.8 kg (09/13 2201) Last BM Date: 12/01/17 100 p.o. 1300 IV No urine output recorded Afebrile vital signs are stable  no labs this a.m. Admit CT: Acute appendicitis without evidence of perforation or abscess/cholelithiasis/uterine fibroids/diffuse hepatic steatosis.  Intake/Output from previous day: 09/13 0701 - 09/14 0700 In: 1395.1 [P.O.:100; I.V.:1098.7; IV Piggyback:196.4] Out: 3 [Blood:3] Intake/Output this shift: No intake/output data recorded.  General appearance: alert, cooperative and no distress Resp: clear to auscultation bilaterally GI: Soft, sore, port sites all look fine.  Lab Results:  Recent Labs    12/01/17 1340  WBC 22.2*  HGB 13.0  HCT 40.5  PLT 411*    BMET Recent Labs    12/01/17 1340  NA 135  K 4.2  CL 103  CO2 23  GLUCOSE 114*  BUN 6  CREATININE 0.78  CALCIUM 9.1   PT/INR No results for input(s): LABPROT, INR in the last 72 hours.  Recent Labs  Lab 12/01/17 1340  AST 24  ALT 36  ALKPHOS 99  BILITOT 1.0  PROT 7.6  ALBUMIN 3.4*     Lipase     Component Value Date/Time   LIPASE 24 12/01/2017 1340     Prior to Admission medications   Not on File    Medications: . celecoxib  200 mg Oral BID  . fentaNYL (SUBLIMAZE) injection  50 mcg Intravenous Once  . gabapentin  300 mg Oral BID   . cefTRIAXone (ROCEPHIN)  IV Stopped (12/01/17 1912)  . dextrose 5 % and 0.9% NaCl 125 mL/hr at 12/01/17 1910  . dextrose 5 % and 0.9% NaCl 75 mL/hr at 12/02/17  0500  . lactated ringers Stopped (12/01/17 2251)  . metronidazole 500 mg (12/02/17 0656)   Assessment/Plan BMI of 40  Gangrenous appendicitis Laparoscopic appendectomy, Axel FillerArmando Ramirez, 12/01/2017  FEN: IV fluids/regular diet. ID: Flagyl/Rocephin 9/13 =>> day 1 DVT: SCDs Follow-up: [facility] -office should call her on Monday.   Plan: Convert her to Augmentin and send her home on 5 days of Augmentin.  Advance diet mobilize and discharge later today.     LOS: 0 days    Pike Scantlebury 12/02/2017 [phone]

## 2017-12-02 NOTE — Progress Notes (Signed)
Pt received, alert and oriented x4. Pt ambulated to bed. Oriented pt to room and use of call light. Pt denies pain at this time. Mild swelling to both eyes noted and hives to neck, abdomen and back noted. Pt denies SOB. Will monitor pt.

## 2017-12-02 NOTE — Discharge Instructions (Signed)
[facility] ______[facility] °LAPAROSCOPIC SURGERY: POST OP INSTRUCTIONS °Always review your discharge instruction sheet given to you by the facility where your surgery was performed. °IF YOU HAVE DISABILITY OR FAMILY LEAVE FORMS, YOU MUST BRING THEM TO THE OFFICE FOR PROCESSING.   °DO NOT GIVE THEM TO YOUR DOCTOR. ° °1. A prescription for pain medication may be given to you upon discharge.  Take your pain medication as prescribed, if needed.  If narcotic pain medicine is not needed, then you may take acetaminophen (Tylenol) or ibuprofen (Advil) as needed. °2. Take your usually prescribed medications unless otherwise directed. °3. If you need a refill on your pain medication, please contact your pharmacy.  They will contact our office to request authorization. Prescriptions will not be filled after 5pm or on week-ends. °4. You should follow a light diet the first few days after arrival home, such as soup and crackers, etc.  Be sure to include lots of fluids daily. °5. Most patients will experience some swelling and bruising in the area of the incisions.  Ice packs will help.  Swelling and bruising can take several days to resolve.  °6. It is common to experience some constipation if taking pain medication after surgery.  Increasing fluid intake and taking a stool softener (such as Colace) will usually help or prevent this problem from occurring.  A mild laxative (Milk of Magnesia or Miralax) should be taken according to package instructions if there are no bowel movements after 48 hours. °7. Unless discharge instructions indicate otherwise, you may remove your bandages 24-48 hours after surgery, and you may shower at that time.  You may have steri-strips (small skin tapes) in place directly over the incision.  These strips should be left on the skin for 7-10 days.  If your surgeon used skin glue on the incision, you may shower in 24 hours.  The glue will flake off over the next 2-3 weeks.  Any sutures or  staples will be removed at the office during your follow-up visit. °8. ACTIVITIES:  You may resume regular (light) daily activities beginning the next day--such as daily self-care, walking, climbing stairs--gradually increasing activities as tolerated.  You may have sexual intercourse when it is comfortable.  Refrain from any heavy lifting or straining until approved by your doctor. °a. You may drive when you are no longer taking prescription pain medication, you can comfortably wear a seatbelt, and you can safely maneuver your car and apply brakes. °b. RETURN TO WORK:  __________________________________________________________ °9. You should see your doctor in the office for a follow-up appointment approximately 2-3 weeks after your surgery.  Make sure that you call for this appointment within a day or two after you arrive home to insure a convenient appointment time. °10. OTHER INSTRUCTIONS: __________________________________________________________________________________________________________________________ __________________________________________________________________________________________________________________________ °WHEN TO CALL YOUR DOCTOR: °1. Fever over 101.0 °2. Inability to urinate °3. Continued bleeding from incision. °4. Increased pain, redness, or drainage from the incision. °5. Increasing abdominal pain ° °The clinic staff is available to answer your questions during regular business hours.  Please don’t hesitate to call and ask to speak to one of the nurses for clinical concerns.  If you have a medical emergency, go to the nearest emergency room or call 911.  A surgeon from [facility] is always on call at the hospital. °[address], [address], [place], [place]  [address] ? [address] [address], [place], [place]   [address] °[phone] ? [phone] ? FAX [phone] °Web site:   [email or web address] ° ° °Laparoscopic Appendectomy, Adult, Care After °These  instructions give you information about caring for yourself after your procedure. Your doctor may also give you more specific instructions. Call your doctor if you have any problems or questions after your procedure. °Follow these instructions at home: °Medicines °· Take over-the-counter and prescription medicines only as told by your doctor. °· Do not drive for 24 hours if you received a sedative. °· Do not drive or use heavy machinery while taking prescription pain medicine. °· If you were prescribed an antibiotic medicine, take it as told by your doctor. Do not stop taking it even if you start to feel better. °Activity °· Do not lift anything that is heavier than 10 pounds (4.5 kg) for 3 weeks or as told by your doctor. °· Do not play contact sports for 3 weeks or as told by your doctor. °· Slowly return to your normal activities. °Bathing °· Keep your cuts from surgery (incisions) clean and dry. °? Gently wash the cuts with soap and water. °? Rinse the cuts with water until the soap is gone. °? Pat the cuts dry with a clean towel. Do not rub the cuts. °· You may take showers after 48 hours. °· Do not take baths, swim, or use a hot tub for 2 weeks or as told by your doctor. °Cut Care °· Follow instructions from your doctor about how to take care of your cuts. Make sure you: °? Wash your hands with soap and water before you change your bandage (dressing). If you do not have soap and water, use hand sanitizer. °? Change your bandage as told by your doctor. °? Leave stitches (sutures), skin glue, or skin tape (adhesive) strips in place. They may need to stay in place for 2 weeks or longer. If tape strips get loose and curl up, you may trim the loose edges. Do not remove tape strips completely unless your doctor says it is okay. °· Check your cuts every day for signs of infection. Check for: °? More redness, swelling, or pain. °? More fluid or blood. °? Warmth. °? Pus or a bad smell. °Other Instructions °· If you  were sent home with a drain, follow instructions from your doctor about how to use it and care for it. °· Take deep breaths. This helps to keep your lungs from getting swollen (inflamed). °· To help with constipation: °? Drink plenty of fluids. °? Eat plenty of fruits and vegetables. °· Keep all follow-up visits as told by your doctor. This is important. °Contact a doctor if: °· You have more redness, swelling, or pain around a cut from surgery. °· You have more fluid or blood coming from a cut. °· Your cut feels warm to the touch. °· You have pus or a bad smell coming from a cut or a bandage. °· The edges of a cut break open after the stitches have been taken out. °· You have pain in your shoulders that gets worse. °· You feel dizzy or you pass out (faint). °· You have shortness of breath. °· You keep feeling sick to your stomach (nauseous). °· You keep throwing up (vomiting). °· You get diarrhea or you cannot control your poop. °· You lose your appetite. °· You have swelling or pain in your legs. °Get help right away if: °· You have a fever. °· You get a rash. °· You have trouble breathing. °· You have sharp pains in your chest. °This information is not intended   to replace advice given to you by your health care provider. Make sure you discuss any questions you have with your health care provider. °Document Released: 01/01/2009 Document Revised: 08/13/2015 Document Reviewed: 08/25/2014 °Elsevier Interactive Patient Education © 2018 [employer] ° °

## 2017-12-03 NOTE — Anesthesia Postprocedure Evaluation (Signed)
Anesthesia Post Note  Patient: Regina [name]  Procedure(s) Performed: APPENDECTOMY LAPAROSCOPIC (N/A )     Patient location during evaluation: PACU Anesthesia Type: General Level of consciousness: awake and alert Pain management: pain level controlled Vital Signs Assessment: post-procedure vital signs reviewed and stable Respiratory status: spontaneous breathing, nonlabored ventilation, respiratory function stable and patient connected to nasal cannula oxygen Cardiovascular status: blood pressure returned to baseline and stable Postop Assessment: no apparent nausea or vomiting Anesthetic complications: no    Last Vitals:  Vitals:   12/02/17 0510 12/02/17 1013  BP: 120/76 126/69  Pulse: 88 86  Resp: 18   Temp: 37.1 C 37 C  SpO2: 99% 98%    Last Pain:  Vitals:   12/02/17 1013  TempSrc: Oral  PainSc:                  Regina [name], Regina [name]

## 2017-12-04 ENCOUNTER — Encounter (HOSPITAL_COMMUNITY): Payer: Self-pay | Admitting: General Surgery

## 2017-12-04 NOTE — Discharge Summary (Signed)
Physician Discharge Summary  Patient ID: Regina [name] MRN: [id number] DOB/AGE: Dec 30, 1975 42 y.o.  Admit date: 12/01/2017 Discharge date: 12/02/2017  Admission Diagnoses:  Acute appendicitis BMI 40  Discharge Diagnoses:  Gangrenous appendicitis BMI 40  Active Problems:   Acute appendicitis   PROCEDURES: Laparoscopic appendectomy, Axel Fillerrmando Ramirez, 12/01/2017   Hospital Course:  Pt is a 42 y/o F with 1/5d h/o abd pain.  Pt with RLQ pain from the start.  She states she had n/v and denies diarrhea.  She has had chills while at home.    On ED eval she underwent CT with revealed Acute appendicitis with no perf.  I did review this personally. Gen Surg was consulted for further eval and mgmt.  Patient was admitted by Dr. Derrell Lollingamirez and taken the operating room at which time she underwent a laparoscopic appendectomy.  She is found to have a gangrenous appendicitis.  She tolerated the procedure well and returned to the floor in satisfactory condition.  He did extremely well first postoperative morning port sites look good.  She did well on antibiotics.  Her diet was advanced and she was ready for discharge the first postoperative day.  She was sent home on 5 days of Augmentin.  She was given instructions for postoperative appendicitis.  Additional discharge: Improved.  CBC Latest Ref Rng & Units 12/01/2017  WBC 4.0 - 10.5 K/uL 22.2(H)  Hemoglobin 12.0 - 15.0 g/dL 40.913.0  Hematocrit 81.136.0 - 46.0 % 40.5  Platelets 150 - 400 K/uL 411(H)   CMP Latest Ref Rng & Units 12/01/2017  Glucose 70 - 99 mg/dL 914(N114(H)  BUN 6 - 20 mg/dL 6  Creatinine 8.290.44 - 5.621.00 mg/dL 1.300.78  Sodium 865135 - 784145 mmol/L 135  Potassium 3.5 - 5.1 mmol/L 4.2  Chloride 98 - 111 mmol/L 103  CO2 22 - 32 mmol/L 23  Calcium 8.9 - 10.3 mg/dL 9.1  Total Protein 6.5 - 8.1 g/dL 7.6  Total Bilirubin 0.3 - 1.2 mg/dL 1.0  Alkaline Phos 38 - 126 U/L 99  AST 15 - 41 U/L 24  ALT 0 - 44 U/L 36   Condition on DC:   Improved  Disposition:    Allergies as of 12/02/2017      Reactions   Morphine And Related Swelling   Patient reports she doesn't remember what happened - thinks she was already having a reaction, but sister on phone stated that her eyes swelled up and patient broke out in hives.    Shellfish Allergy       Medication List    TAKE these medications   acetaminophen 500 MG tablet Commonly known as:  TYLENOL You can take 2 tablets every 8 hours around-the-clock.  You can alternate this with the ibuprofen, or tramadol. You can buy this over-the-counter at any drugstore without a prescription. Do not take more than 4000 mg of Tylenol(acetaminophen) in any 24-hour.  This can harm your liver.   amoxicillin-clavulanate 875-125 MG tablet Commonly known as:  AUGMENTIN Take 1 tablet by mouth every 12 (twelve) hours.   ibuprofen 200 MG tablet Commonly known as:  ADVIL,MOTRIN You can take 2 to 3 tablets every 6 hours as needed for pain.  You can alternate this with plain Tylenol or Ultram as needed for pain.  You can buy this at the drugstore without a prescription.  Do not exceed 3 tablets every 6 hours it can harm your kidneys.   traMADol 50 MG tablet Commonly known as:  ULTRAM Take 1 tablet (50  mg total) by mouth every 6 (six) hours as needed (mild pain).      Follow-up Information    [facility] Follow up.   Specialty:  General Surgery Why:  Our office should call you Monday for a follow-up appointment.  Be at the office 30 minutes early for check-in, bring your photo ID and insurance information.  You can also bring your disability paperwork to this office. Contact information: [address] [place] [address] [phone]           Signed: Sherrie George 12/04/2017, 3:14 PM

## 2019-04-03 IMAGING — CT CT ABD-PELV W/ CM
2 of 5 series · 17 of 46 positions shown, 19 images · IV contrast (omnipaque)
Comparison: None.

CLINICAL DATA: Right lower quadrant pain for 1 day

EXAM:
CT ABDOMEN AND PELVIS WITH CONTRAST
TECHNIQUE: Multidetector CT imaging of the abdomen and pelvis was performed
using the standard protocol following bolus administration of
intravenous contrast.
CONTRAST:  100mL OMNIPAQUE IOHEXOL 300 MG/ML  SOLN

[Series 3: a/p w/ 5mm · axial · 0.79mm/px · z∈[+921,+1331]mm · 14 of 92 slices shown, 16 images]
[im 5/92  soft-tissue]
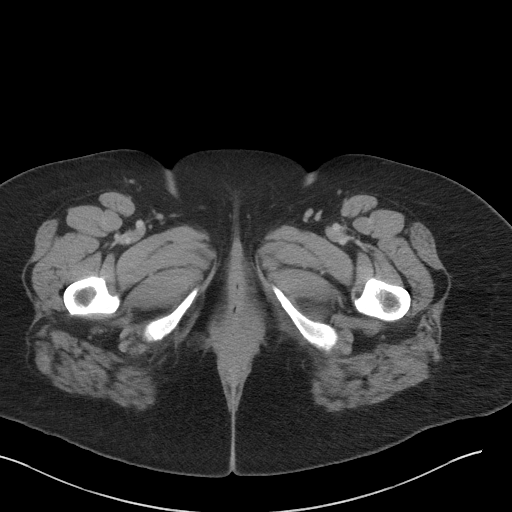
[im 5/92  bone]
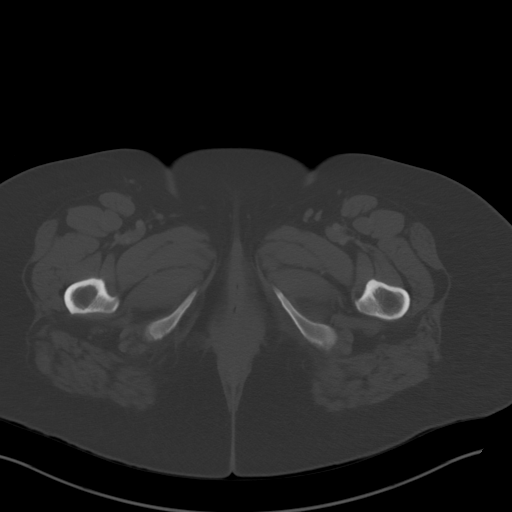
[im 10/92  soft-tissue]
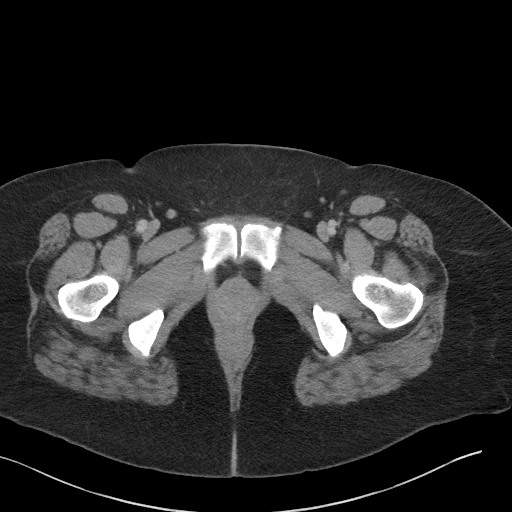
[im 20/92  soft-tissue]
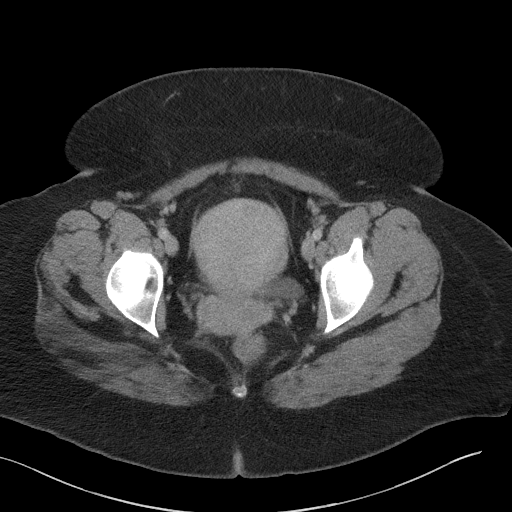
[im 24/92  soft-tissue]
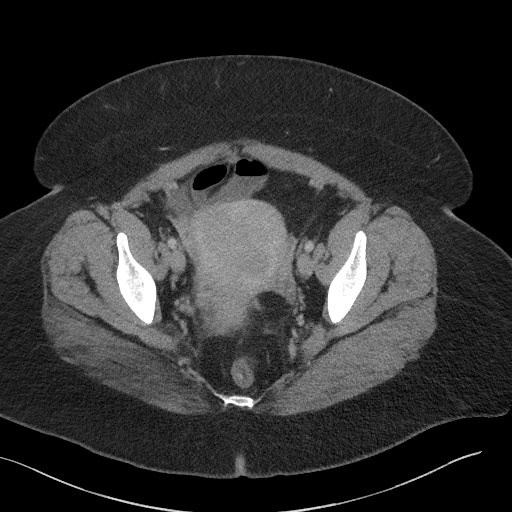
[im 29/92  soft-tissue]
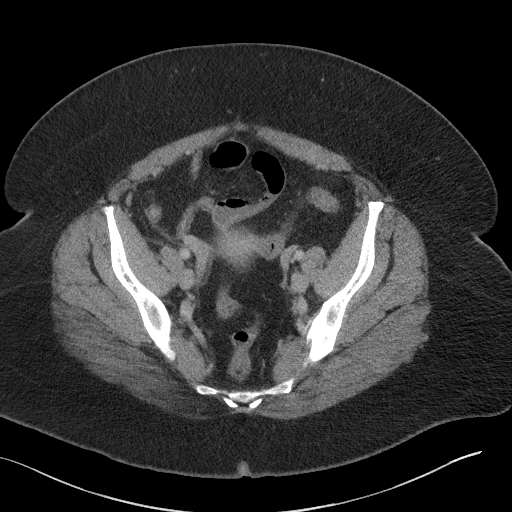
[im 39/92  soft-tissue]
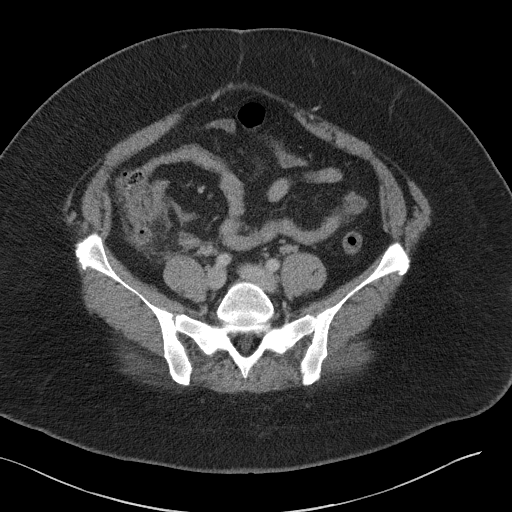
[im 44/92  soft-tissue]
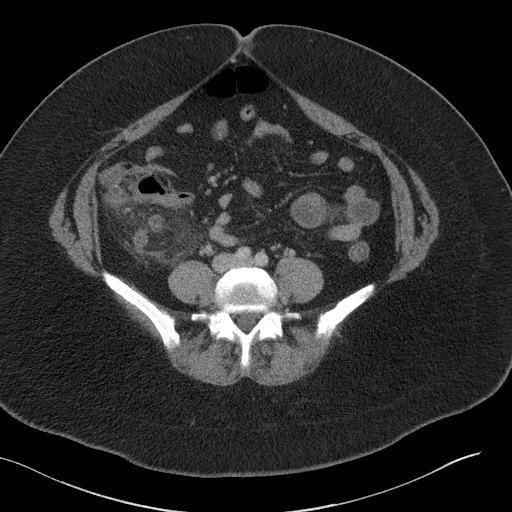
[im 48/92  soft-tissue]
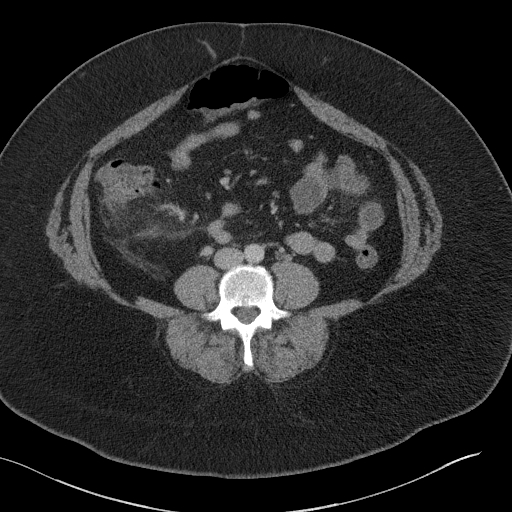
[im 53/92  soft-tissue]
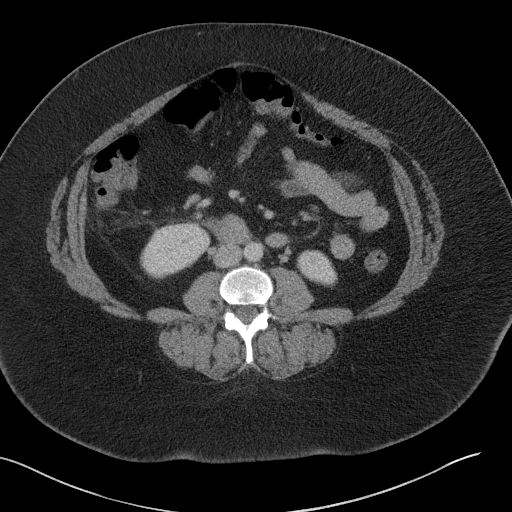
[im 53/92  bone]
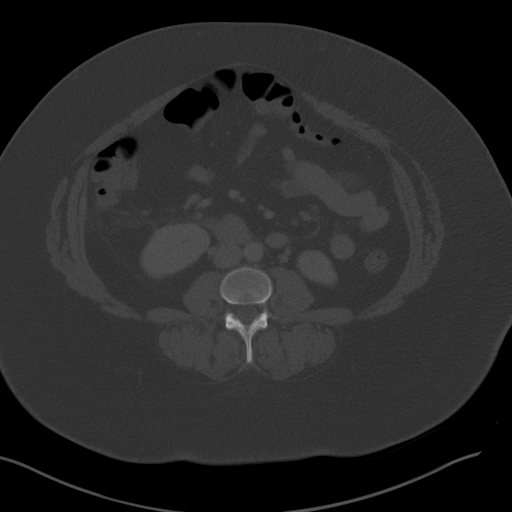
[im 63/92  soft-tissue]
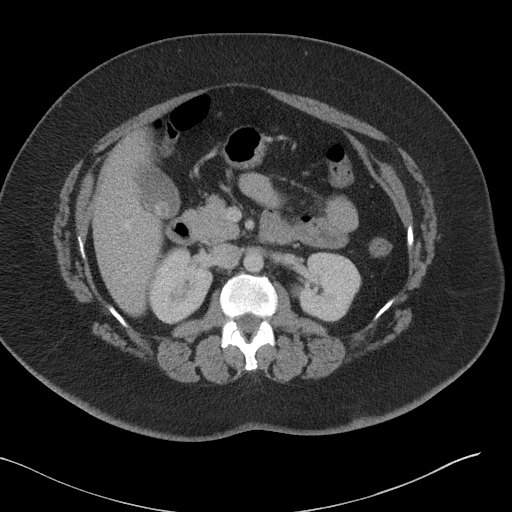
[im 68/92  soft-tissue]
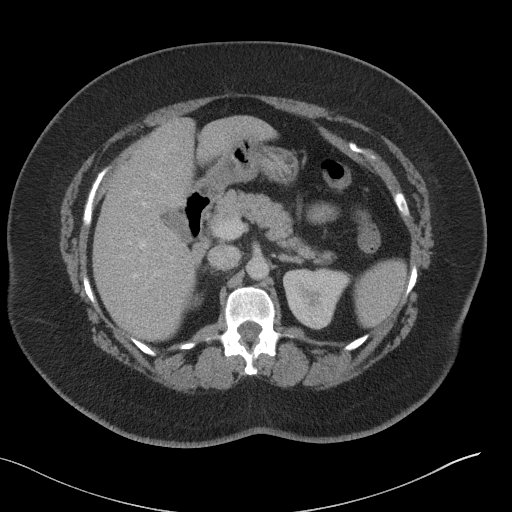
[im 72/92  soft-tissue]
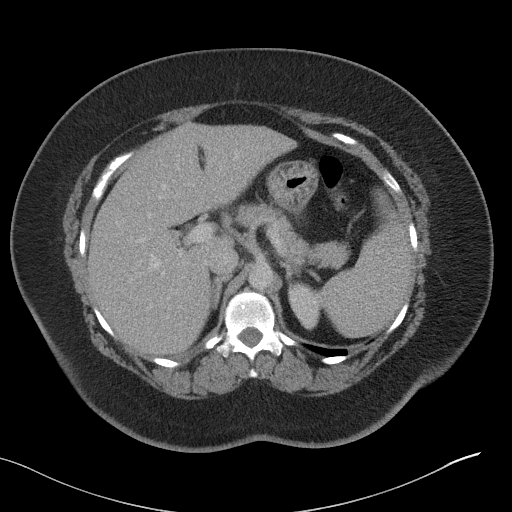
[im 82/92  soft-tissue]
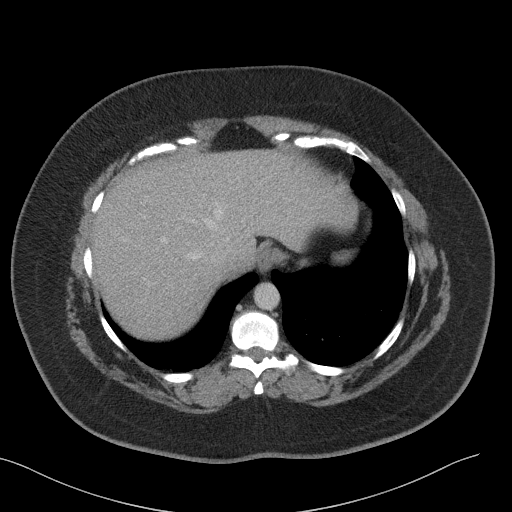
[im 87/92  soft-tissue]
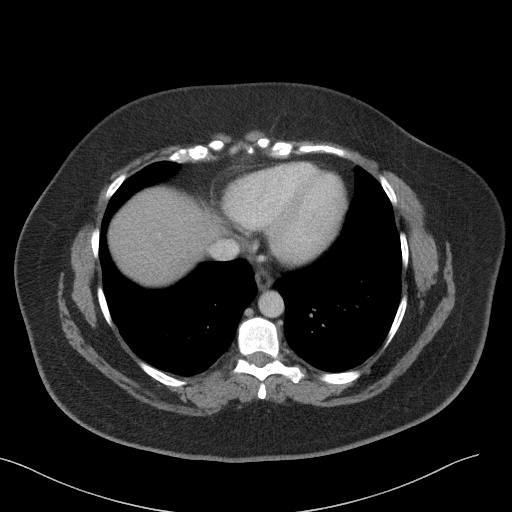

[Series 6: a/p w/ cor · coronal · 0.90mm/px · 3 of 179 slices shown]
[im 60/179  soft-tissue]
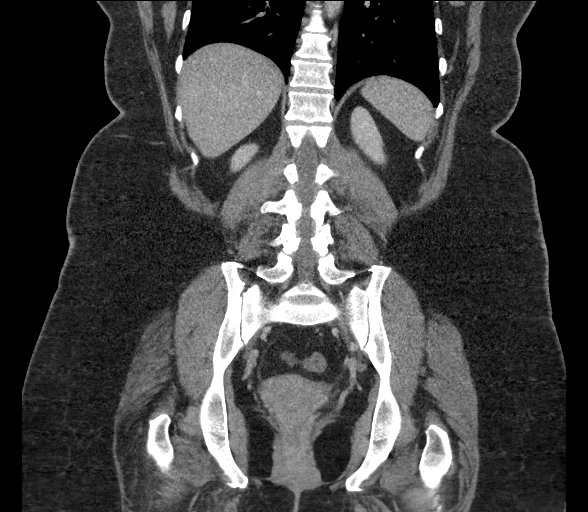
[im 80/179  soft-tissue]
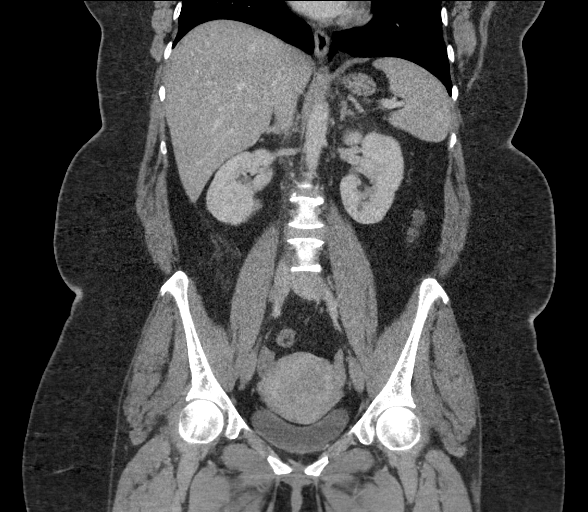
[im 99/179  soft-tissue]
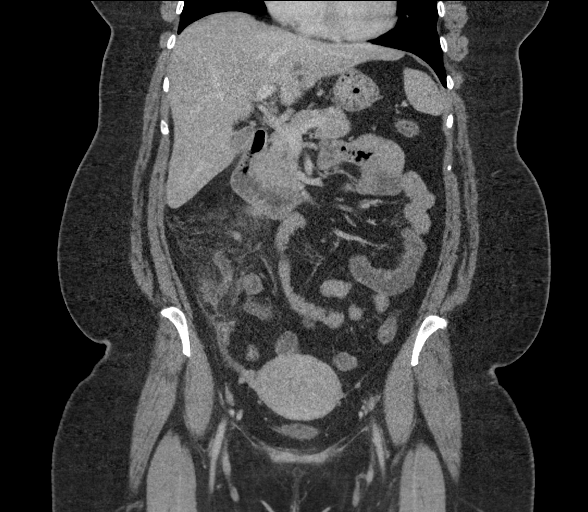

[17 of 46 positions shown; findings below may reference images not displayed]

FINDINGS: Lower chest: Subsegmental atelectasis

Hepatobiliary: Diffuse hepatic steatosis.  1.3 cm gallstone.

Pancreas: Unremarkable

Spleen: Upper normal in size.  No mass.

Adrenals/Urinary Tract: Unremarkable

Stomach/Bowel: The appendix is distended. There is stranding in the
adjacent fat. There is no extraluminal bowel gas. There is no
abscess. These findings are compatible with acute appendicitis
without perforation. No disproportionate dilatation of bowel to
suggest obstruction. Decompressed stomach.

Vascular/Lymphatic: No evidence of aortic aneurysm. No abnormal
retroperitoneal adenopathy.

Reproductive: 2.4 cm fundal uterine fibroid. Adnexa are
unremarkable.

Other: Trace free fluid in the right hemipelvis.

Musculoskeletal: No vertebral compression deformity.
IMPRESSION: Acute appendicitis without evidence of perforation or abscess.

Cholelithiasis.

Uterine fibroid.

Trace free-fluid.

Diffuse hepatic steatosis.

Critical Value/emergent results were called by telephone at the time
of interpretation on 12/01/2017 at [DATE] to Dr. SSETIRAM GIGZX , who
verbally acknowledged these results.

## 2019-07-15 DIAGNOSIS — Z01419 Encounter for gynecological examination (general) (routine) without abnormal findings: Secondary | ICD-10-CM | POA: Diagnosis not present

## 2019-07-15 DIAGNOSIS — N920 Excessive and frequent menstruation with regular cycle: Secondary | ICD-10-CM | POA: Diagnosis not present

## 2019-07-15 DIAGNOSIS — Z6841 Body Mass Index (BMI) 40.0 and over, adult: Secondary | ICD-10-CM | POA: Diagnosis not present

## 2020-03-02 DIAGNOSIS — Z13220 Encounter for screening for lipoid disorders: Secondary | ICD-10-CM | POA: Diagnosis not present

## 2020-03-02 DIAGNOSIS — Z91013 Allergy to seafood: Secondary | ICD-10-CM | POA: Diagnosis not present

## 2020-03-02 DIAGNOSIS — Z0000 Encounter for general adult medical examination without abnormal findings: Secondary | ICD-10-CM | POA: Diagnosis not present

## 2020-04-23 ENCOUNTER — Ambulatory Visit: Payer: Self-pay | Admitting: Allergy & Immunology

## 2020-06-09 ENCOUNTER — Encounter: Payer: Self-pay | Admitting: Allergy and Immunology

## 2020-06-09 ENCOUNTER — Ambulatory Visit (INDEPENDENT_AMBULATORY_CARE_PROVIDER_SITE_OTHER): Payer: BC Managed Care – PPO | Admitting: Allergy and Immunology

## 2020-06-09 ENCOUNTER — Other Ambulatory Visit: Payer: Self-pay

## 2020-06-09 VITALS — BP 126/72 | HR 56 | Resp 16 | Ht 65.6 in | Wt 256.0 lb

## 2020-06-09 DIAGNOSIS — T7800XA Anaphylactic reaction due to unspecified food, initial encounter: Secondary | ICD-10-CM

## 2020-06-09 DIAGNOSIS — J4520 Mild intermittent asthma, uncomplicated: Secondary | ICD-10-CM

## 2020-06-09 DIAGNOSIS — R7989 Other specified abnormal findings of blood chemistry: Secondary | ICD-10-CM | POA: Diagnosis not present

## 2020-06-09 DIAGNOSIS — R197 Diarrhea, unspecified: Secondary | ICD-10-CM | POA: Diagnosis not present

## 2020-06-09 MED ORDER — AUVI-Q 0.3 MG/0.3ML IJ SOAJ: INTRAMUSCULAR | 3 refills | Status: AC

## 2020-06-09 MED ORDER — ALBUTEROL SULFATE HFA 108 (90 BASE) MCG/ACT IN AERS: INHALATION_SPRAY | RESPIRATORY_TRACT | 1 refills | Status: AC

## 2020-06-09 NOTE — Patient Instructions (Addendum)
  1.  Allergen avoidance measures - dust mite, cockroach, pollen, shellfish  2.  Blood - celiac screen w/IgA, Hep B/C ab screen  3. If needed:   A. Albuterol HFA - 2 inhalations every 4-6 hours  B. Auvi-Q 0.3, benadryl, MD/ER evaluation for allergic reaction  4. Need a life long plan for healthy eating that allows a slow loss of weight to ideal body weight.  5.  Further evaluation???  Yes, if with persistent diarrhea in the face of a gluten-free diet.

## 2020-06-09 NOTE — Progress Notes (Signed)
[place] - [place]   Dear Roslynn Amble,  Thank you for referring Regina [name] to the [facility] [place] on 06/09/2020.   Below is a summation of this patient's evaluation and recommendations.  Thank you for your referral. I will keep you informed about this patient's response to treatment.   If you have any questions please do not hesitate to contact me.   Sincerely,  Jessica Priest, MD Allergy / Immunology [facility]   ______________________________________________________________________    NEW PATIENT NOTE  Referring Provider: Adrienne Mocha, PA Primary Provider: Patient, No Pcp Per Date of office visit: 06/09/2020    Subjective:   Chief Complaint:  Regina [name] (DOB: 20-Mar-1976) is a 45 y.o. female who presents to the clinic on 06/09/2020 with a chief complaint of food allergy (Shellfish) .     HPI: Regina [name] presents to this clinic in evaluation of several issues.  First, Regina [name] apparently has a shellfish allergy that showed up around the age of 56.  Regina [name] develops lip and tongue swelling and difficulty swallowing when eating shrimp.  Regina [name] has been shellfish free for many years.  Regina [name] does not have an EpiPen.  In addition, Regina [name] apparently developed a "funny mouth feeling" when eating pecan ice cream and Regina [name] has been without pecan consumption for the past 2 years.  Apparently Regina [name] can eat a walnut and hazelnut and peanut with no problem.  Second, Regina [name] has developed intermittent urticaria which fortunately is an infrequent issue at this point in time.  Regina [name] last outbreak of urticaria, that manifested itself as red raised itchy lesions that never heal with scar or hyperpigmentation, was about 6 months ago and prior to that it was about 2 years ago.  Regina [name] wonders if there is food consumption precipitating this issue.  Regina [name] has been manipulating Regina [name] food  consumption and eliminating oatmeal and rice.  Third, Regina [name] develops wheezing.  Regina [name] has had a history of childhood bronchitis and now Regina [name] just wheezes intermittently over the course of the past several decades.  Occasionally Regina [name] will use Regina [name] son's short acting bronchodilator about 1 or 2 times per year.  There is not really any coughing or chest tightness but Regina [name] will get out of breath if Regina [name] exerts herself.  Regina [name] does not have a history of cold air induced bronchospastic symptoms.  Fourth, Regina [name] has chronic diarrhea multiple times per day.  This has been going on for decades.  This appears to occur especially in the morning where Regina [name] has tenesmus where Regina [name] needs to semiemergently defecate.  Regina [name] was gluten-free for about 6 months earlier this past year and at about 3 months of a gluten-free diet this issue resolved but Regina [name] has now gone back to eating gluten and has chronic diarrhea.  Past Medical History:  Diagnosis Date  . Shellfish allergy     Past Surgical History:  Procedure Laterality Date  . CESAREAN SECTION  01/14/2003  . LAPAROSCOPIC APPENDECTOMY N/A 12/01/2017   Procedure: APPENDECTOMY LAPAROSCOPIC;  Surgeon: Axel Filler, MD;  Location: [facility] OR;  Service: General;  Laterality: N/A;    Allergies as of 06/09/2020      Reactions   Morphine And Related Swelling   Patient reports Regina [name] doesn't remember what happened - thinks Regina [name] was already having a reaction, but sister on phone stated that Regina [name] eyes swelled up and patient broke out in hives.  Shellfish Allergy       Medication List    acetaminophen 500 MG tablet Commonly known as: TYLENOL Stopped by: Damarea Merkel Claudia Pollock, MD   amoxicillin-clavulanate 875-125 MG tablet Commonly known as: AUGMENTIN Stopped by: Vickii Volland Claudia Pollock, MD   ibuprofen 200 MG tablet Commonly known as: ADVIL Stopped by: Jaedyn Lard Claudia Pollock, MD   traMADol 50 MG tablet Commonly known as: ULTRAM Stopped by: Moselle Rister Claudia Pollock, MD       Review of systems negative except as  noted in HPI / PMHx or noted below:  Review of Systems  Constitutional: Negative.   HENT: Negative.   Eyes: Negative.   Respiratory: Negative.   Cardiovascular: Negative.   Gastrointestinal: Negative.   Genitourinary: Negative.   Musculoskeletal: Negative.   Skin: Negative.   Neurological: Negative.   Endo/Heme/Allergies: Negative.   Psychiatric/Behavioral: Negative.     History reviewed. No pertinent family history.  Social History   Socioeconomic History  . Marital status: Married    Spouse name: Not on file  . Number of children: Not on file  . Years of education: Not on file  . Highest education level: Not on file  Occupational History  . Not on file  Tobacco Use  . Smoking status: Never Smoker  . Smokeless tobacco: Never Used  Substance and Sexual Activity  . Alcohol use: Yes    Comment: Ocassional  . Drug use: Never  . Sexual activity: Not on file  Other Topics Concern  . Not on file  Social History Narrative  . Not on file    Environmental and Social history  Lives in a house with a dry environment, dog located inside the household, no carpet in the bedroom, no plastic on the bed, no plastic on the pillow, no smoking ongoing with inside the household.  Regina [name] works as a Therapist, music and is not really exposed to a tremendous amount of particulate matter or fumes.  Objective:   Vitals:   06/09/20 1009  BP: 126/72  Pulse: (!) 56  Resp: 16  SpO2: 97%   Height: 5' 5.6" (166.6 cm) Weight: 256 lb (116.1 kg)  Physical Exam Constitutional:      Appearance: Regina [name] is not diaphoretic.  HENT:     Head: Normocephalic.     Right Ear: Tympanic membrane, ear canal and external ear normal.     Left Ear: Tympanic membrane, ear canal and external ear normal.     Nose: Nose normal. No mucosal edema or rhinorrhea.     Mouth/Throat:     Pharynx: Uvula midline. No oropharyngeal exudate.  Eyes:     Conjunctiva/sclera: Conjunctivae normal.  Neck:     Thyroid: No  thyromegaly.     Trachea: Trachea normal. No tracheal tenderness or tracheal deviation.  Cardiovascular:     Rate and Rhythm: Normal rate and regular rhythm.     Heart sounds: Normal heart sounds, S1 normal and S2 normal. No murmur heard.   Pulmonary:     Effort: No respiratory distress.     Breath sounds: Normal breath sounds. No stridor. No wheezing or rales.  Lymphadenopathy:     Head:     Right side of head: No tonsillar adenopathy.     Left side of head: No tonsillar adenopathy.     Cervical: No cervical adenopathy.  Skin:    Findings: No erythema or rash.     Nails: There is no clubbing.  Neurological:     Mental Status: Regina [name] is alert.  Diagnostics: Allergy skin tests were performed.  Regina [name] demonstrated hypersensitivity to dust mite, cockroach, trees, grasses, weeds.  Regina [name] also demonstrated hypersensitivity to shellfish members including shrimp, crab, lobster, scallop, and Regina [name] also demonstrated hypersensitivity to potato, green pea, navy bean, onion, cabbage, cucumber, grapes, watermelon.  Spirometry was performed and demonstrated an FEV1 of 2.31 @ 89 % of predicted. FEV1/FVC = 0.90  Review of blood tests obtained 02 March 2020 identifies WBC 7.0, absolute eosinophil 200, absolute lymphocyte 2800, hemoglobin 12.0, platelet 397, creatinine 0.70 mg/DL ALT 38 U/L, AST 49 U/L, TSH 0.76 IU/mL  Assessment and Plan:    1. Asthma, mild intermittent, well-controlled   2. Allergy with anaphylaxis due to food   3. Elevated liver function tests   4. Diarrhea, unspecified type     1.  Allergen avoidance measures - dust mite, cockroach, pollen, shellfish  2.  Blood - celiac screen w/IgA, Hep B/C ab screen  3. If needed:   A. Albuterol HFA - 2 inhalations every 4-6 hours  B. Auvi-Q 0.3, benadryl, MD/ER evaluation for allergic reaction  4. Need a life long plan for healthy eating that allows a slow loss of weight to ideal body weight.  5.  Further evaluation???  Yes, if with  persistent diarrhea in the face of a gluten-free diet.  Regina [name] has an atopic immune system and this appears to be causing some problems with Regina [name] airway and Regina [name] also appears to have a significant food allergy directed against shellfish.  Regina [name] other skin test results showing sensitivity to specific foods are probably tied up with some cross-reactivity with proteins that resemble pollens and are contained within those food products.  We have given Regina [name] a list of Regina [name] positive skin tests and Regina [name] can work through whether or not there is an issue that develops when consuming this food.  Regina [name] history is somewhat consistent with celiac disease and we will work through that issue by checking for transglutaminase IgA antibodies and Regina [name] does have slight elevation of liver function tests and will see if this is a viral issue checking for hepatitis B and C.  Most likely Regina [name] slight elevation liver function tests are reflection of fatty liver.  I did have a talk with Regina [name] today about approaching Regina [name] eating behavior in a way that allows Regina [name] to undergo a very slow loss of weight to obtain ideal body weight at some point in the future.  I will contact Regina [name] with the results of Regina [name] blood test once they are available for review.  Jessica Priest, MD Allergy / Immunology [facility] [place]

## 2020-06-10 ENCOUNTER — Encounter: Payer: Self-pay | Admitting: Allergy and Immunology

## 2020-06-10 LAB — HEPATITIS B VIRUS (PROFILE VI): Hep B C IgM: NEGATIVE

## 2020-06-10 LAB — CELIAC PANEL 10

## 2020-06-11 LAB — HEPATITIS C ANTIBODY: Hep C Virus Ab: <0.1 s/co ratio (ref 0.0–0.9)

## 2020-06-11 LAB — CELIAC PANEL 10
Antigliadin Abs, IgA: 5 units (ref 0–19)
Gliadin IgG: 2 units (ref 0–19)
IgA/Immunoglobulin A, Serum: 313 mg/dL (ref 87–352)
Tissue Transglut Ab: <2 U/mL (ref 0–5)
Transglutaminase IgA: <2 U/mL (ref 0–3)

## 2020-06-11 LAB — HEPATITIS B VIRUS (PROFILE VI)
Hep B Core Total Ab: NEGATIVE
Hep B E Ab: NEGATIVE
Hep B E Ag: NEGATIVE
Hep B Surface Ab, Qual: REACTIVE
Hepatitis B Surface Ag: NEGATIVE

## 2020-06-30 ENCOUNTER — Ambulatory Visit (INDEPENDENT_AMBULATORY_CARE_PROVIDER_SITE_OTHER): Payer: Self-pay | Admitting: Family Medicine

## 2020-07-14 ENCOUNTER — Ambulatory Visit (INDEPENDENT_AMBULATORY_CARE_PROVIDER_SITE_OTHER): Payer: Self-pay | Admitting: Family Medicine

## 2020-07-28 ENCOUNTER — Ambulatory Visit (INDEPENDENT_AMBULATORY_CARE_PROVIDER_SITE_OTHER): Payer: BC Managed Care – PPO | Admitting: Family Medicine

## 2020-07-28 ENCOUNTER — Other Ambulatory Visit: Payer: Self-pay

## 2020-07-28 ENCOUNTER — Encounter (INDEPENDENT_AMBULATORY_CARE_PROVIDER_SITE_OTHER): Payer: Self-pay | Admitting: Family Medicine

## 2020-07-28 VITALS — BP 123/76 | HR 60 | Temp 98.0°F | Ht 65.0 in | Wt 254.0 lb

## 2020-07-28 DIAGNOSIS — Z0289 Encounter for other administrative examinations: Secondary | ICD-10-CM

## 2020-07-28 DIAGNOSIS — R0602 Shortness of breath: Secondary | ICD-10-CM

## 2020-07-28 DIAGNOSIS — Z9189 Other specified personal risk factors, not elsewhere classified: Secondary | ICD-10-CM | POA: Diagnosis not present

## 2020-07-28 DIAGNOSIS — R5383 Other fatigue: Secondary | ICD-10-CM

## 2020-07-28 DIAGNOSIS — Z1331 Encounter for screening for depression: Secondary | ICD-10-CM | POA: Diagnosis not present

## 2020-07-28 DIAGNOSIS — Z6841 Body Mass Index (BMI) 40.0 and over, adult: Secondary | ICD-10-CM

## 2020-07-28 DIAGNOSIS — Z91018 Allergy to other foods: Secondary | ICD-10-CM | POA: Diagnosis not present

## 2020-07-28 DIAGNOSIS — E66813 Obesity, class 3: Secondary | ICD-10-CM

## 2020-07-29 LAB — CBC WITH DIFFERENTIAL/PLATELET
Basophils Absolute: 0 10*3/uL (ref 0.0–0.2)
Basos: 1 %
EOS (ABSOLUTE): 0.1 10*3/uL (ref 0.0–0.4)
Eos: 1 %
Hematocrit: 37 % (ref 34.0–46.6)
Hemoglobin: 11.8 g/dL (ref 11.1–15.9)
Immature Grans (Abs): 0 10*3/uL (ref 0.0–0.1)
Immature Granulocytes: 0 %
Lymphocytes Absolute: 3.3 10*3/uL — ABNORMAL HIGH (ref 0.7–3.1)
Lymphs: 40 %
MCH: 24.3 pg — ABNORMAL LOW (ref 26.6–33.0)
MCHC: 31.9 g/dL (ref 31.5–35.7)
MCV: 76 fL — ABNORMAL LOW (ref 79–97)
Monocytes Absolute: 0.4 10*3/uL (ref 0.1–0.9)
Monocytes: 5 %
Neutrophils Absolute: 4.4 10*3/uL (ref 1.4–7.0)
Neutrophils: 53 %
Platelets: 470 10*3/uL — ABNORMAL HIGH (ref 150–450)
RBC: 4.85 x10E6/uL (ref 3.77–5.28)
RDW: 15.6 % — ABNORMAL HIGH (ref 11.7–15.4)
WBC: 8.4 10*3/uL (ref 3.4–10.8)

## 2020-07-29 LAB — COMPREHENSIVE METABOLIC PANEL
ALT: 52 IU/L — ABNORMAL HIGH (ref 0–32)
AST: 47 IU/L — ABNORMAL HIGH (ref 0–40)
Albumin/Globulin Ratio: 1.4 (ref 1.2–2.2)
Albumin: 4.2 g/dL (ref 3.8–4.8)
Alkaline Phosphatase: 131 IU/L — ABNORMAL HIGH (ref 44–121)
BUN/Creatinine Ratio: 14 (ref 9–23)
BUN: 10 mg/dL (ref 6–24)
Bilirubin Total: <0.2 mg/dL (ref 0.0–1.2)
CO2: 22 mmol/L (ref 20–29)
Calcium: 9.7 mg/dL (ref 8.7–10.2)
Chloride: 103 mmol/L (ref 96–106)
Creatinine, Ser: 0.72 mg/dL (ref 0.57–1.00)
Globulin, Total: 2.9 g/dL (ref 1.5–4.5)
Glucose: 99 mg/dL (ref 65–99)
Potassium: 4.7 mmol/L (ref 3.5–5.2)
Sodium: 137 mmol/L (ref 134–144)
Total Protein: 7.1 g/dL (ref 6.0–8.5)
eGFR: 106 mL/min/{1.73_m2} (ref >59)

## 2020-07-29 LAB — LIPID PANEL
Chol/HDL Ratio: 3.7 ratio (ref 0.0–4.4)
Cholesterol, Total: 247 mg/dL — ABNORMAL HIGH (ref 100–199)
HDL: 66 mg/dL (ref >39)
LDL Chol Calc (NIH): 154 mg/dL — ABNORMAL HIGH (ref 0–99)
Triglycerides: 154 mg/dL — ABNORMAL HIGH (ref 0–149)
VLDL Cholesterol Cal: 27 mg/dL (ref 5–40)

## 2020-07-29 LAB — INSULIN, RANDOM: INSULIN: 15.1 u[IU]/mL (ref 2.6–24.9)

## 2020-07-29 LAB — VITAMIN B12: Vitamin B-12: 650 pg/mL (ref 232–1245)

## 2020-07-29 LAB — TSH: TSH: 0.762 u[IU]/mL (ref 0.450–4.500)

## 2020-07-29 LAB — HEMOGLOBIN A1C
Est. average glucose Bld gHb Est-mCnc: 137 mg/dL
Hgb A1c MFr Bld: 6.4 % — ABNORMAL HIGH (ref 4.8–5.6)

## 2020-07-29 LAB — T4, FREE: Free T4: 1.08 ng/dL (ref 0.82–1.77)

## 2020-07-29 LAB — FOLATE: Folate: 5.9 ng/mL (ref >3.0)

## 2020-07-29 LAB — VITAMIN D 25 HYDROXY (VIT D DEFICIENCY, FRACTURES): Vit D, 25-Hydroxy: 12.9 ng/mL — ABNORMAL LOW (ref 30.0–100.0)

## 2020-07-29 LAB — T3: T3, Total: 136 ng/dL (ref 71–180)

## 2020-08-04 NOTE — Progress Notes (Signed)
Dear Regina Amble, PA,   Thank you for referring Regina Regina [name] to our clinic. The following note includes my evaluation and treatment recommendations.  Chief Complaint:   OBESITY Regina Regina [name] (MR# [id number]) is a 45 y.o. female who presents for evaluation and treatment of obesity and related comorbidities. Current BMI is Body mass index is 42.27 kg/m. Regina [name] has been struggling with her weight for many years and has been unsuccessful in either losing weight, maintaining weight loss, or reaching her healthy weight goal.  Regina Regina [name] is currently in the action stage of change and ready to dedicate time achieving and maintaining a healthier weight. Regina Regina [name] is interested in becoming our patient and working on intensive lifestyle modifications including (but not limited to) diet and exercise for weight loss.  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  She lives with her husband and 2 sons.  She craves chips, bread, rice with sauce.  Snacks on coffee and chips.  She skips breakfast daily.  Went to the doctor for the first time in 12 years last year.  She is guarded Regina [name] and not sure of her personal history and declines to share any family history.  Regina Regina [name]: Her family eats meals together, she thinks her family will eat healthier with her, her desired weight loss is 106 pounds, she started gaining weight in 2014, her heaviest weight ever was 256 pounds, she craves chips, bread, and rice, she snacks frequently in the evenings, she skips breakfast frequently, she is frequently drinking liquids with calories, she frequently eats larger portions than normal and she struggles with emotional eating.  Depression Screen Regina Regina [name]'s Food and Mood (modified PHQ-9) score was 5.  Depression screen Regina Regina [name]  Decreased Interest 0  Down, Depressed, Hopeless 0  PHQ - 2 Score 0  Altered sleeping 0  Tired, decreased energy 1  Change in  appetite 1  Feeling bad or failure about yourself  2  Trouble concentrating 1  Moving slowly or fidgety/restless 0  Suicidal thoughts 0  PHQ-9 Score 5  Difficult doing work/chores Not difficult at all   Assessment/Plan:   Orders Placed This Encounter  Procedures  . Vitamin B12  . CBC with Differential/Platelet  . Comprehensive metabolic panel  . Folate  . Hemoglobin A1c  . Insulin, random  . Lipid panel  . T3  . T4, free  . TSH  . VITAMIN D 25 Hydroxy (Vit-D Deficiency, Fractures)  . EKG 12-Lead   1. Other fatigue Regina Regina [name] denies daytime somnolence and denies waking up still tired. Patent has a history of symptoms of snoring. Regina [name] generally gets 7 hours of sleep per night, and states that she has generally restful sleep. Snoring is present. Apneic episodes are not present. Epworth Sleepiness Score is 3.  Regina [name] does feel that her weight is causing her energy to be lower than it should be. Fatigue may be related to obesity, depression or many other causes. Labs will be ordered, and in the meanwhile, Regina [name] will focus on self care including making healthy food choices, increasing physical activity and focusing on stress reduction.  Will check EKG and labs Regina [name].  - EKG 12-Lead - Vitamin B12 - CBC with Differential/Platelet - Comprehensive metabolic panel - Folate - Hemoglobin A1c - Insulin, random - Lipid panel - T3 - T4, free - TSH - VITAMIN D 25 Hydroxy (Vit-D Deficiency, Fractures)  2. SOBOE (shortness of breath on exertion) Regina [name] notes increasing  shortness of breath with exercising and seems to be worsening over time with weight gain. She notes getting out of breath sooner with activity than she used to. This has gotten worse recently. Regina [name] denies shortness of breath at rest or orthopnea.  Regina Regina [name] does feel that she gets out of breath more easily that she used to when she exercises. [name]'s shortness of breath appears to be obesity related and exercise induced. She has  agreed to work on weight loss and gradually increase exercise to treat her exercise induced shortness of breath. Will continue to monitor closely.  Will check IC and labs Regina [name].  - Vitamin B12 - CBC with Differential/Platelet - Comprehensive metabolic panel - Folate - Hemoglobin A1c - Insulin, random - Lipid panel - T3 - T4, free - TSH - VITAMIN D 25 Hydroxy (Vit-D Deficiency, Fractures)  3. Food allergy Regina Regina [name] was seen by an allergist recently and was given a list of allergies.  She is not sure and eats what she wants to.  Plan:  Recommend only introducing 1 fruit or 1 vegetable at a time to see what she has side effects to and what she can tolerate.  4. Depression screening Regina [name] was screened for depression as part of her new patient workup Regina [name].  PHQ-9 is 5.  Depression screening is negative.  5. At risk for deficient intake of food Regina Regina [name] was given extensive education and counseling Regina [name] of more than 10 minutes on risks associated with deficient food intake.  She skips meals now.  Says she really only eats once meal per day now.  Counseled her on the importance of following our prescribed meal plan and eating adequate amounts of protein.  Discussed with Regina Regina [name] that inadequate food intake over longer periods of time can slow their metabolism down significantly.   6. Class 3 severe obesity with serious comorbidity and body mass index (BMI) of 40.0 to 44.9 in adult, unspecified obesity type (HCC)  Regina [name] is currently in the action stage of change and her goal is to continue with weight loss efforts. I recommend [name] begin the structured treatment plan as Regina [name]:  She has agreed to the Category 2 Plan.  Exercise goals: As is.   Behavioral modification strategies: no skipping meals, meal planning and cooking strategies and planning for success.  She was informed of the importance of frequent follow-up visits to maximize her success with intensive lifestyle  modifications for her multiple health conditions. She was informed we would discuss her lab results at her next visit unless there is a critical issue that needs to be addressed sooner. Regina [name] agreed to keep her next visit at the agreed upon time to discuss these results.  Objective:   Blood pressure 123/76, pulse 60, temperature 98 F (36.7 C), height 5\' 5"  (1.651 m), weight 254 lb (115.2 kg), SpO2 99 %. Body mass index is 42.27 kg/m.  EKG: Normal sinus rhythm, rate 58 bpm.  Indirect Calorimeter completed Regina [name] shows a VO2 of 275 and a REE of 1914.  Her calculated basal metabolic rate is thus her basal metabolic rate is better than expected.  General: Cooperative, alert, well developed, in no acute distress. HEENT: Conjunctivae and lids unremarkable. Cardiovascular: Regular rhythm.  Lungs: Normal work of breathing. Neurologic: No focal deficits.   Lab Results  Component Value Date   CREATININE 0.72 07/28/2020   BUN 10 07/28/2020   NA 137 07/28/2020   K 4.7 07/28/2020   CL 103 07/28/2020   CO2 22 07/28/2020  Lab Results  Component Value Date   ALT 52 (H) 07/28/2020   AST 47 (H) 07/28/2020   ALKPHOS 131 (H) 07/28/2020   BILITOT <0.2 07/28/2020   Lab Results  Component Value Date   HGBA1C 6.4 (H) 07/28/2020   Lab Results  Component Value Date   INSULIN 15.1 07/28/2020   Lab Results  Component Value Date   TSH 0.762 07/28/2020   Lab Results  Component Value Date   CHOL 247 (H) 07/28/2020   HDL 66 07/28/2020   LDLCALC 154 (H) 07/28/2020   TRIG 154 (H) 07/28/2020   CHOLHDL 3.7 07/28/2020   Lab Results  Component Value Date   WBC 8.4 07/28/2020   HGB 11.8 07/28/2020   HCT 37.0 07/28/2020   MCV 76 (L) 07/28/2020   PLT 470 (H) 07/28/2020   Attestation Statements:   This is the patient's first visit at [facility]. The patient's NEW PATIENT PACKET was reviewed at length. Included in the packet: current and past health history, medications,  allergies, ROS, gynecologic history (women only), surgical history, family history, social history, weight history, weight loss surgery history (for those that have had weight loss surgery), nutritional evaluation, mood and food questionnaire, PHQ9, Epworth questionnaire, sleep Regina [name] questionnaire, patient life and health improvement goals questionnaire. These will all be scanned into the patient's chart under media.   During the visit, I independently reviewed the patient's EKG, bioimpedance scale results, and indirect calorimeter results. I used this information to tailor a meal plan for the patient that will help her to lose weight and will improve her obesity-related conditions going forward. I performed a medically necessary appropriate examination and/or evaluation. I discussed the assessment and treatment plan with the patient. The patient was provided an opportunity to ask questions and all were answered. The patient agreed with the plan and demonstrated an understanding of the instructions. Labs were ordered at this visit and will be reviewed at the next visit unless more critical results need to be addressed immediately. Clinical information was updated and documented in the EMR.   I, Insurance claims handler, CMA, am acting as Energy manager for [employer], DO.  I have reviewed the above documentation for accuracy and completeness, and I agree with the above. Carlye Grippe, D.O.  The 21st Century Cures Act was signed into law in 2016 which includes the topic of electronic health records.  This provides immediate access to information in MyChart.  This includes consultation notes, operative notes, office notes, lab results and pathology reports.  If you have any questions about what you read please let us know at your next visit so we can discuss your concerns and take corrective action if need be.  We are right here with you.

## 2020-08-11 ENCOUNTER — Encounter (INDEPENDENT_AMBULATORY_CARE_PROVIDER_SITE_OTHER): Payer: Self-pay | Admitting: Family Medicine

## 2020-08-11 ENCOUNTER — Other Ambulatory Visit: Payer: Self-pay

## 2020-08-11 ENCOUNTER — Ambulatory Visit (INDEPENDENT_AMBULATORY_CARE_PROVIDER_SITE_OTHER): Payer: BC Managed Care – PPO | Admitting: Family Medicine

## 2020-08-11 VITALS — BP 124/72 | HR 71 | Temp 98.4°F | Ht 65.0 in | Wt 255.0 lb

## 2020-08-11 DIAGNOSIS — E7849 Other hyperlipidemia: Secondary | ICD-10-CM | POA: Insufficient documentation

## 2020-08-11 DIAGNOSIS — Z9189 Other specified personal risk factors, not elsewhere classified: Secondary | ICD-10-CM

## 2020-08-11 DIAGNOSIS — K760 Fatty (change of) liver, not elsewhere classified: Secondary | ICD-10-CM | POA: Diagnosis not present

## 2020-08-11 DIAGNOSIS — R7303 Prediabetes: Secondary | ICD-10-CM | POA: Insufficient documentation

## 2020-08-11 DIAGNOSIS — Z6841 Body Mass Index (BMI) 40.0 and over, adult: Secondary | ICD-10-CM

## 2020-08-11 DIAGNOSIS — E559 Vitamin D deficiency, unspecified: Secondary | ICD-10-CM

## 2020-08-11 DIAGNOSIS — E611 Iron deficiency: Secondary | ICD-10-CM

## 2020-08-11 MED ORDER — FERROUS SULFATE 325 (65 FE) MG PO TBEC: 325.0000 mg | DELAYED_RELEASE_TABLET | Freq: Every day | ORAL | 0 refills | Status: AC

## 2020-08-11 MED ORDER — VITAMIN D (ERGOCALCIFEROL) 1.25 MG (50000 UNIT) PO CAPS: 50000.0000 [IU] | ORAL_CAPSULE | ORAL | 0 refills | Status: AC

## 2020-08-11 NOTE — Patient Instructions (Signed)
The 10-year ASCVD risk score [name] DC Montez Hageman., et al., 2013) is: 0.6%   Values used to calculate the score:     Age: 45 years     Sex: Female     Is Non-Hispanic African American: Yes     Diabetic: No     Tobacco smoker: No     Systolic Blood Pressure: 124 mmHg     Is BP treated: No     HDL Cholesterol: 66 mg/dL     Total Cholesterol: 247 mg/dL

## 2020-08-19 NOTE — Progress Notes (Signed)
Chief Complaint:   OBESITY Regina [name] is here to discuss her progress with her obesity treatment plan along with follow-up of her obesity related diagnoses.   Today's visit was #: 2 Starting weight: 254 lbs Starting date: 07/28/2020 Today's weight: 255 lbs Today's date: 08/11/2020 Weight change since last visit: +1 lb Total lbs lost to date: +1 lb Body mass index is 42.43 kg/m.   Interim History:  Regina [name] is here today for her first follow-up office visit since starting the program with us.  All blood work/ lab tests that were recently ordered by myself or an outside provider were reviewed with patient today per their request.   Extended time was spent counseling her on all new disease processes that were discovered or preexisting ones that are worsening.  she understands that many of these abnormalities will need to monitored regularly along with the current treatment plan of prudent dietary changes, in which we are making each and every office visit, to improve these health parameters.  We reviewed her new meal plan in detail and questions were answered.  Patient's food recall appears to be accurate and consistent with what is on plan when she is following it.   When eating on plan, her hunger and cravings are well controlled.    Regina [name] ate her potato chips and coffee with sugar and cream.  Did not weigh proteins at all, skipped meals, and ate a lot off plan.  Has not meal planned or prepped.  For dinner last night, nothing.  The night prior, chicken (unknown ounces), mashed potatoes, and green beans.  Current Meal Plan: the Category 2 Plan for 30% of the time.  Current Exercise Plan: Going to the gym for 3 hours 2 times per week.  Assessment/Plan:   Meds ordered this encounter  Medications   Vitamin D, Ergocalciferol, (DRISDOL) 1.25 MG (50000 UNIT) CAPS capsule    Sig: Take 1 capsule (50,000 Units total) by mouth every 7 (seven) days.    Dispense:  4 capsule    Refill:  0    ferrous sulfate 325 (65 FE) MG EC tablet    Sig: Take 1 tablet (325 mg total) by mouth daily with breakfast.    Dispense:  30 tablet    Refill:  0    1. Other hyperlipidemia Course: Not at goal. Lipid-lowering medications: None.  Eats chips, bread, rice.  Plan:  New.  Discussed labs with patient today.  Elevated LDL and elevated TG.  Follow prudent nutritional plan and decrease saturated/trans fats.  Increase exercise.  Recheck labs in 4-6 months.  Dietary changes: Increase soluble fiber, decrease simple carbohydrates, decrease saturated fat. Exercise changes: Moderate to vigorous-intensity aerobic activity 150 minutes per week or as tolerated. We will continue to monitor along with PCP/specialists as it pertains to her weight loss journey.  Lab Results  Component Value Date   CHOL 247 (H) 07/28/2020   HDL 66 07/28/2020   LDLCALC 154 (H) 07/28/2020   TRIG 154 (H) 07/28/2020   CHOLHDL 3.7 07/28/2020   Lab Results  Component Value Date   ALT 52 (H) 07/28/2020   AST 47 (H) 07/28/2020   ALKPHOS 131 (H) 07/28/2020   BILITOT <0.2 07/28/2020   The 10-year ASCVD risk score Regina [name](Regina [name] DC Jr., et al., 2013) is: 0.6%   Values used to calculate the score:     Age: [age] years     Sex: Female     Is Non-Hispanic African American: Yes  Diabetic: No     Tobacco smoker: No     Systolic Blood Pressure: 124 mmHg     Is BP treated: No     HDL Cholesterol: 66 mg/dL     Total Cholesterol: 247 mg/dL  2. Prediabetes Not at goal. Goal is HgbA1c < 5.7.  Medication: None.  Positive family history of diabetes in mom and dad.  Plan:  New.  Discussed labs with patient today.  Declines medications.  Wants to avoid at all costs.  Follow prudent nutritional plan and weight loss.  Counseling and handouts given.  Recheck labs in 3-4 months.  She will continue to focus on protein-rich, low simple carbohydrate foods. We reviewed the importance of hydration, regular exercise for stress reduction, and restorative  sleep.   Lab Results  Component Value Date   HGBA1C 6.4 (H) 07/28/2020   Lab Results  Component Value Date   INSULIN 15.1 07/28/2020   3. NAFLD (nonalcoholic fatty liver disease) Confirmed on CT in 11/2017.  Plan:  New.  Discussed labs with patient today.  Elevated ALT and AST and alkaline phosphatase.  Weight loss and change in diet needed.  Recheck labs in 3-4 months.  NAFLD is an umbrella term that encompasses a disease spectrum that includes steatosis (fat) without inflammation, steatohepatitis (NASH; fat + inflammation in a characteristic pattern), and cirrhosis. Bland steatosis is felt to be a benign condition, with extremely low to no risk of progression to cirrhosis, whereas NASH can progress to cirrhosis. The mainstay of treatment of NAFLD includes lifestyle modification to achieve weight loss, at least 7% of current body weight. Low carbohydrate diets can be beneficial in improving NAFLD liver histology. Additionally, exercise, even the absence of weight loss can have beneficial effects on the patient's metabolic profile and liver health.   4. Vitamin D deficiency Not at goal. Current vitamin D is 12.9, tested on 07/28/2020. Optimal goal > 50 ng/dL.  She says she was never told she was deficient in the past.  Plan:  New.  Discussed labs with patient today.    - Discussed importance of vitamin D to their health and well-being.  - possible symptoms of low Vitamin D can be low energy, depressed mood, muscle aches, joint aches, osteoporosis etc. - low Vitamin D levels may be linked to an increased risk of cardiovascular events and even increased risk of cancers- such as colon and breast.  - I recommend pt take weekly prescription vit D - see script below   - Informed patient this may be a lifelong thing, and she was encouraged to continue to take the medicine until told otherwise.   - we will need to monitor levels regularly (every 3-4 mo on average) to keep levels within normal  limits.  - weight loss will likely improve availability of vitamin D, thus encouraged [name] to continue with meal plan and their weight loss efforts to further improve this condition - pt's questions and concerns regarding this condition addressed.  - Start Vitamin D, Ergocalciferol, (DRISDOL) 1.25 MG (50000 UNIT) CAPS capsule; Take 1 capsule (50,000 Units total) by mouth every 7 (seven) days.  Dispense: 4 capsule; Refill: 0  5. Iron deficiency She endorses fatigue and tiredness.  Plan:  Worsening.  Discussed labs with patient today.  Start OTC 325 mg ferrous sulfate daily.  Nutrition: Iron-rich foods include dark leafy greens, red and white meats, eggs, seafood, and beans.  Certain foods and drinks prevent your body from absorbing iron properly. Avoid eating these  foods in the same meal as iron-rich foods or with iron supplements. These foods include: coffee, black tea, and red wine; milk, dairy products, and foods that are high in calcium; beans and soybeans; whole grains. Constipation can be a side effect of iron supplementation. Increased water and fiber intake are helpful. Water goal: > 2 liters/day. Fiber goal: > 25 grams/day.  CBC Latest Ref Rng & Units 07/28/2020 12/01/2017  WBC 3.4 - 10.8 x10E3/uL 8.4 22.2(H)  Hemoglobin 11.1 - 15.9 g/dL 20.9 47.0  Hematocrit 96.2 - 46.6 % 37.0 40.5  Platelets 150 - 450 x10E3/uL 470(H) 411(H)   Lab Results  Component Value Date   VITAMINB12 650 07/28/2020   - Start ferrous sulfate 325 (65 FE) MG EC tablet; Take 1 tablet (325 mg total) by mouth daily with breakfast.  Dispense: 30 tablet; Refill: 0  6. At risk for heart disease Due to Makell's current state of health and medical condition(s) (prediabetes, hyperlipidemia, and NAFLD), she is at a higher risk for heart disease.  This puts the patient at much greater risk to subsequently develop cardiopulmonary conditions that can significantly affect patient's quality of life in a negative manner.    At  least 22 minutes were spent on counseling Tanijah about these concerns today, and I stressed the importance of reversing risks factors of obesity, especially truncal and visceral fat, hypertension, hyperlipidemia, and pre-diabetes.  The initial goal is to lose at least 5-10% of starting weight to help reduce these risk factors.  Counseling:  Intensive lifestyle modifications were discussed with Arbor as the most appropriate first line of treatment.  she will continue to work on diet, exercise, and weight loss efforts.  We will continue to reassess these conditions on a fairly regular basis in an attempt to decrease the patient's overall morbidity and mortality.  Evidence-based interventions for health behavior change were utilized today including the discussion of self monitoring techniques, problem-solving barriers, and SMART goal setting techniques.  Specifically, regarding patient's less desirable eating habits and patterns, we employed the technique of small changes when Netty has not been able to fully commit to her prudent nutritional plan.  7. Obesity, current BMI 42.5  Course: Richanda is currently in the action stage of change. As such, her goal is to continue with weight loss efforts.   Nutrition goals: She has agreed to the Category 2 Plan.   Exercise goals: For substantial health benefits, adults should do at least 150 minutes (2 hours and 30 minutes) a week of moderate-intensity, or 75 minutes (1 hour and 15 minutes) a week of vigorous-intensity aerobic physical activity, or an equivalent combination of moderate- and vigorous-intensity aerobic activity. Aerobic activity should be performed in episodes of at least 10 minutes, and preferably, it should be spread throughout the week.  Behavioral modification strategies: increasing lean protein intake, decreasing simple carbohydrates, keeping healthy foods in the home and planning for success.  Leiloni has agreed to follow-up with our clinic in 2  weeks. She was informed of the importance of frequent follow-up visits to maximize her success with intensive lifestyle modifications for her multiple health conditions.   Objective:   Blood pressure 124/72, pulse 71, temperature 98.4 F (36.9 C), height 5\' 5"  (1.651 m), weight 255 lb (115.7 kg), SpO2 98 %. Body mass index is 42.43 kg/m.  General: Cooperative, alert, well developed, in no acute distress. HEENT: Conjunctivae and lids unremarkable. Cardiovascular: Regular rhythm.  Lungs: Normal work of breathing. Neurologic: No focal deficits.   Lab Results  Component Value Date   CREATININE 0.72 07/28/2020   BUN 10 07/28/2020   NA 137 07/28/2020   K 4.7 07/28/2020   CL 103 07/28/2020   CO2 22 07/28/2020   Lab Results  Component Value Date   ALT 52 (H) 07/28/2020   AST 47 (H) 07/28/2020   ALKPHOS 131 (H) 07/28/2020   BILITOT <0.2 07/28/2020   Lab Results  Component Value Date   HGBA1C 6.4 (H) 07/28/2020   Lab Results  Component Value Date   INSULIN 15.1 07/28/2020   Lab Results  Component Value Date   TSH 0.762 07/28/2020   Lab Results  Component Value Date   CHOL 247 (H) 07/28/2020   HDL 66 07/28/2020   LDLCALC 154 (H) 07/28/2020   TRIG 154 (H) 07/28/2020   CHOLHDL 3.7 07/28/2020   Lab Results  Component Value Date   WBC 8.4 07/28/2020   HGB 11.8 07/28/2020   HCT 37.0 07/28/2020   MCV 76 (L) 07/28/2020   PLT 470 (H) 07/28/2020   Attestation Statements:   Reviewed by clinician on day of visit: allergies, medications, problem list, medical history, surgical history, family history, social history, and previous encounter notes.  I, Insurance claims handler, CMA, am acting as Energy manager for [employer], DO.  I have reviewed the above documentation for accuracy and completeness, and I agree with the above. Carlye Grippe, D.O.  The 21st Century Cures Act was signed into law in 2016 which includes the topic of electronic health records.  This provides  immediate access to information in MyChart.  This includes consultation notes, operative notes, office notes, lab results and pathology reports.  If you have any questions about what you read please let us know at your next visit so we can discuss your concerns and take corrective action if need be.  We are right here with you.

## 2020-09-01 ENCOUNTER — Ambulatory Visit (INDEPENDENT_AMBULATORY_CARE_PROVIDER_SITE_OTHER): Payer: BC Managed Care – PPO | Admitting: Family Medicine

## 2020-09-17 ENCOUNTER — Ambulatory Visit (INDEPENDENT_AMBULATORY_CARE_PROVIDER_SITE_OTHER): Payer: BC Managed Care – PPO | Admitting: Family Medicine

## 2020-09-28 DIAGNOSIS — Z01419 Encounter for gynecological examination (general) (routine) without abnormal findings: Secondary | ICD-10-CM | POA: Diagnosis not present

## 2020-09-28 DIAGNOSIS — Z1231 Encounter for screening mammogram for malignant neoplasm of breast: Secondary | ICD-10-CM | POA: Diagnosis not present

## 2020-09-28 DIAGNOSIS — Z6841 Body Mass Index (BMI) 40.0 and over, adult: Secondary | ICD-10-CM | POA: Diagnosis not present

## 2021-05-25 DIAGNOSIS — Z23 Encounter for immunization: Secondary | ICD-10-CM | POA: Diagnosis not present

## 2021-05-25 DIAGNOSIS — Z0000 Encounter for general adult medical examination without abnormal findings: Secondary | ICD-10-CM | POA: Diagnosis not present

## 2021-05-25 DIAGNOSIS — E7800 Pure hypercholesterolemia, unspecified: Secondary | ICD-10-CM | POA: Diagnosis not present

## 2021-10-27 ENCOUNTER — Encounter (INDEPENDENT_AMBULATORY_CARE_PROVIDER_SITE_OTHER): Payer: Self-pay

## 2022-04-18 DIAGNOSIS — Z01419 Encounter for gynecological examination (general) (routine) without abnormal findings: Secondary | ICD-10-CM | POA: Diagnosis not present

## 2022-04-18 DIAGNOSIS — Z1231 Encounter for screening mammogram for malignant neoplasm of breast: Secondary | ICD-10-CM | POA: Diagnosis not present

## 2022-04-18 DIAGNOSIS — Z6841 Body Mass Index (BMI) 40.0 and over, adult: Secondary | ICD-10-CM | POA: Diagnosis not present

## 2022-04-26 DIAGNOSIS — E7849 Other hyperlipidemia: Secondary | ICD-10-CM | POA: Diagnosis not present

## 2022-04-26 DIAGNOSIS — E559 Vitamin D deficiency, unspecified: Secondary | ICD-10-CM | POA: Diagnosis not present

## 2022-04-26 DIAGNOSIS — K760 Fatty (change of) liver, not elsewhere classified: Secondary | ICD-10-CM | POA: Diagnosis not present

## 2022-06-01 DIAGNOSIS — E559 Vitamin D deficiency, unspecified: Secondary | ICD-10-CM | POA: Diagnosis not present

## 2022-06-01 DIAGNOSIS — R7301 Impaired fasting glucose: Secondary | ICD-10-CM | POA: Diagnosis not present

## 2022-06-01 DIAGNOSIS — E7849 Other hyperlipidemia: Secondary | ICD-10-CM | POA: Diagnosis not present

## 2022-06-01 DIAGNOSIS — K760 Fatty (change of) liver, not elsewhere classified: Secondary | ICD-10-CM | POA: Diagnosis not present

## 2022-10-05 DIAGNOSIS — E782 Mixed hyperlipidemia: Secondary | ICD-10-CM | POA: Diagnosis not present

## 2022-10-05 DIAGNOSIS — E559 Vitamin D deficiency, unspecified: Secondary | ICD-10-CM | POA: Diagnosis not present

## 2022-10-05 DIAGNOSIS — Z91148 Patient's other noncompliance with medication regimen for other reason: Secondary | ICD-10-CM | POA: Diagnosis not present

## 2022-11-02 DIAGNOSIS — E782 Mixed hyperlipidemia: Secondary | ICD-10-CM | POA: Diagnosis not present

## 2022-11-02 DIAGNOSIS — K760 Fatty (change of) liver, not elsewhere classified: Secondary | ICD-10-CM | POA: Diagnosis not present

## 2022-11-02 DIAGNOSIS — R7301 Impaired fasting glucose: Secondary | ICD-10-CM | POA: Diagnosis not present

## 2022-12-27 DIAGNOSIS — R7301 Impaired fasting glucose: Secondary | ICD-10-CM | POA: Diagnosis not present

## 2022-12-27 DIAGNOSIS — K760 Fatty (change of) liver, not elsewhere classified: Secondary | ICD-10-CM | POA: Diagnosis not present

## 2022-12-27 DIAGNOSIS — E782 Mixed hyperlipidemia: Secondary | ICD-10-CM | POA: Diagnosis not present

## 2022-12-27 DIAGNOSIS — E559 Vitamin D deficiency, unspecified: Secondary | ICD-10-CM | POA: Diagnosis not present

## 2023-03-04 DIAGNOSIS — D509 Iron deficiency anemia, unspecified: Secondary | ICD-10-CM | POA: Diagnosis not present

## 2023-03-04 DIAGNOSIS — K760 Fatty (change of) liver, not elsewhere classified: Secondary | ICD-10-CM | POA: Diagnosis not present

## 2023-03-04 DIAGNOSIS — E782 Mixed hyperlipidemia: Secondary | ICD-10-CM | POA: Diagnosis not present

## 2023-03-04 DIAGNOSIS — E559 Vitamin D deficiency, unspecified: Secondary | ICD-10-CM | POA: Diagnosis not present

## 2023-04-20 DIAGNOSIS — Z1231 Encounter for screening mammogram for malignant neoplasm of breast: Secondary | ICD-10-CM | POA: Diagnosis not present

## 2023-05-25 DIAGNOSIS — E782 Mixed hyperlipidemia: Secondary | ICD-10-CM | POA: Diagnosis not present

## 2023-05-25 DIAGNOSIS — D509 Iron deficiency anemia, unspecified: Secondary | ICD-10-CM | POA: Diagnosis not present

## 2023-05-25 DIAGNOSIS — K760 Fatty (change of) liver, not elsewhere classified: Secondary | ICD-10-CM | POA: Diagnosis not present

## 2023-05-25 DIAGNOSIS — E559 Vitamin D deficiency, unspecified: Secondary | ICD-10-CM | POA: Diagnosis not present

## 2023-05-25 DIAGNOSIS — R7301 Impaired fasting glucose: Secondary | ICD-10-CM | POA: Diagnosis not present

## 2023-06-19 DIAGNOSIS — D125 Benign neoplasm of sigmoid colon: Secondary | ICD-10-CM | POA: Diagnosis not present

## 2023-06-19 DIAGNOSIS — D123 Benign neoplasm of transverse colon: Secondary | ICD-10-CM | POA: Diagnosis not present

## 2023-06-19 DIAGNOSIS — K635 Polyp of colon: Secondary | ICD-10-CM | POA: Diagnosis not present

## 2023-06-19 DIAGNOSIS — K5730 Diverticulosis of large intestine without perforation or abscess without bleeding: Secondary | ICD-10-CM | POA: Diagnosis not present

## 2023-06-19 DIAGNOSIS — Z1211 Encounter for screening for malignant neoplasm of colon: Secondary | ICD-10-CM | POA: Diagnosis not present

## 2024-01-26 DIAGNOSIS — R7303 Prediabetes: Secondary | ICD-10-CM | POA: Diagnosis not present

## 2024-01-26 DIAGNOSIS — E7800 Pure hypercholesterolemia, unspecified: Secondary | ICD-10-CM | POA: Diagnosis not present

## 2024-01-26 DIAGNOSIS — E782 Mixed hyperlipidemia: Secondary | ICD-10-CM | POA: Diagnosis not present

## 2024-01-26 DIAGNOSIS — K760 Fatty (change of) liver, not elsewhere classified: Secondary | ICD-10-CM | POA: Diagnosis not present

## 2024-01-26 DIAGNOSIS — D509 Iron deficiency anemia, unspecified: Secondary | ICD-10-CM | POA: Diagnosis not present

## 2024-03-07 DIAGNOSIS — E7849 Other hyperlipidemia: Secondary | ICD-10-CM | POA: Diagnosis not present

## 2024-03-07 DIAGNOSIS — K760 Fatty (change of) liver, not elsewhere classified: Secondary | ICD-10-CM | POA: Diagnosis not present

## 2024-03-07 DIAGNOSIS — Z0001 Encounter for general adult medical examination with abnormal findings: Secondary | ICD-10-CM | POA: Diagnosis not present

## 2024-03-07 DIAGNOSIS — R7301 Impaired fasting glucose: Secondary | ICD-10-CM | POA: Diagnosis not present
